# Patient Record
Sex: Female | Born: 1995 | Race: Black or African American | Hispanic: No | Marital: Married | Smoking: Current every day smoker
Health system: Southern US, Community
[De-identification: ages and names within clinical notes are randomized; demographics above are authoritative.]

## PROBLEM LIST (undated history)

## (undated) ENCOUNTER — Inpatient Hospital Stay (HOSPITAL_COMMUNITY): Payer: Self-pay

## (undated) DIAGNOSIS — J45909 Unspecified asthma, uncomplicated: Secondary | ICD-10-CM

## (undated) HISTORY — PX: CYST EXCISION: SHX5701

## (undated) HISTORY — PX: CYST REMOVAL HAND: SHX6279

---

## 2015-12-18 ENCOUNTER — Encounter (HOSPITAL_COMMUNITY): Payer: Self-pay | Admitting: *Deleted

## 2015-12-18 ENCOUNTER — Inpatient Hospital Stay (HOSPITAL_COMMUNITY)
Admission: AD | Admit: 2015-12-18 | Discharge: 2015-12-19 | Disposition: A | Discharge status: Home / Self Care | Payer: Medicaid Other | Source: Ambulatory Visit | Attending: Family Medicine | Admitting: Family Medicine

## 2015-12-18 DIAGNOSIS — Z3202 Encounter for pregnancy test, result negative: Secondary | ICD-10-CM | POA: Insufficient documentation

## 2015-12-18 DIAGNOSIS — R109 Unspecified abdominal pain: Secondary | ICD-10-CM

## 2015-12-18 DIAGNOSIS — J45909 Unspecified asthma, uncomplicated: Secondary | ICD-10-CM | POA: Insufficient documentation

## 2015-12-18 DIAGNOSIS — R102 Pelvic and perineal pain: Secondary | ICD-10-CM | POA: Insufficient documentation

## 2015-12-18 DIAGNOSIS — F17210 Nicotine dependence, cigarettes, uncomplicated: Secondary | ICD-10-CM | POA: Diagnosis not present

## 2015-12-18 DIAGNOSIS — O26899 Other specified pregnancy related conditions, unspecified trimester: Secondary | ICD-10-CM

## 2015-12-18 HISTORY — DX: Unspecified asthma, uncomplicated: J45.909

## 2015-12-18 LAB — URINALYSIS, ROUTINE W REFLEX MICROSCOPIC
BILIRUBIN URINE: NEGATIVE
Glucose, UA: NEGATIVE mg/dL
Hgb urine dipstick: NEGATIVE
KETONES UR: 15 mg/dL — AB
LEUKOCYTES UA: NEGATIVE
NITRITE: NEGATIVE
Protein, ur: 30 mg/dL — AB
pH: 6 (ref 5.0–8.0)

## 2015-12-18 LAB — CBC WITH DIFFERENTIAL/PLATELET
BASOS PCT: 1 %
Basophils Absolute: 0.1 10*3/uL (ref 0.0–0.1)
EOS ABS: 0 10*3/uL (ref 0.0–0.7)
Eosinophils Relative: 1 %
HEMATOCRIT: 37.3 % (ref 36.0–46.0)
HEMOGLOBIN: 13.6 g/dL (ref 12.0–15.0)
Lymphocytes Relative: 51 %
Lymphs Abs: 3.5 10*3/uL (ref 0.7–4.0)
MCH: 31.4 pg (ref 26.0–34.0)
MCHC: 36.5 g/dL — ABNORMAL HIGH (ref 30.0–36.0)
MCV: 86.1 fL (ref 78.0–100.0)
Monocytes Absolute: 0.4 10*3/uL (ref 0.1–1.0)
Monocytes Relative: 6 %
NEUTROS ABS: 2.7 10*3/uL (ref 1.7–7.7)
NEUTROS PCT: 41 %
Platelets: 209 10*3/uL (ref 150–400)
RBC: 4.33 MIL/uL (ref 3.87–5.11)
RDW: 11.9 % (ref 11.5–15.5)
WBC: 6.7 10*3/uL (ref 4.0–10.5)

## 2015-12-18 LAB — URINE MICROSCOPIC-ADD ON

## 2015-12-18 LAB — POCT PREGNANCY, URINE
PREG TEST UR: NEGATIVE
PREG TEST UR: NEGATIVE

## 2015-12-18 NOTE — MAU Note (Signed)
Pt states she has been having really bad cramps for the last 3 days, 3 positive home preg test at home. Denies bleeding.

## 2015-12-18 NOTE — MAU Provider Note (Signed)
History     CSN: [id number]  Arrival date and time: 12/18/15 2149   First Provider Initiated Contact with Patient 12/18/15 2231      Chief Complaint  Patient presents with  . Abdominal Pain  . Possible Pregnancy   HPI Ms. Beverly Santana is a 20 y.o.  who presents to MAU today with complaint of lower abdominal cramping and possible pregnancy. The patient states recent onset of moderate lower abdominal cramping. She states +HPT today. She denies vaginal bleeding or discharge. She states cramping is mild and intermittent. She has not taken anything for pain.   OB History    Gravida Para Term Preterm AB TAB SAB Ectopic Multiple Living   Past Medical History  Diagnosis Date  . Asthma     Past Surgical History  Procedure Laterality Date  . Cyst removal hand    . Cyst excision      History reviewed. No pertinent family history.  Social History  Substance Use Topics  . Smoking status: Current Every Day Smoker    Types: Cigarettes  . Smokeless tobacco: None  . Alcohol Use: No    Allergies:  Allergies  Allergen Reactions  . Latex Swelling  . Reglan [Metoclopramide]   . Sulfa Antibiotics     No prescriptions prior to admission    Review of Systems  Constitutional: Negative for fever and malaise/fatigue.  Gastrointestinal: Positive for abdominal pain. Negative for nausea, vomiting, diarrhea and constipation.  Genitourinary: Negative for dysuria, urgency and frequency.       Neg - vaginal bleeding, discharge   Physical Exam   Blood pressure 109/83, pulse 62, temperature 98.6 F (37 C), temperature source Oral, resp. rate 16, height 5' (1.524 m), weight 96 lb 4 oz (43.659 kg), last menstrual period 10/27/2015, SpO2 98 %.  Physical Exam  Nursing note and vitals reviewed. Constitutional: She is oriented to person, place, and time. She appears well-developed and well-nourished. No distress.  HENT:  Head: Normocephalic and atraumatic.   Cardiovascular: Normal rate.   Respiratory: Effort normal.  GI: Soft. She exhibits no distension.  Neurological: She is alert and oriented to person, place, and time.  Skin: Skin is warm and dry. No erythema.  Psychiatric: She has a normal mood and affect.    Results for orders placed or performed during the hospital encounter of 12/18/15 (from the past 24 hour(s))  Urinalysis, Routine w reflex microscopic (not at [facility])     Status: Abnormal   Collection Time: 12/18/15 10:06 PM  Result Value Ref Range   Color, Urine YELLOW YELLOW   APPearance CLEAR CLEAR   Specific Gravity, Urine >1.030 (H) 1.005 - 1.030   pH 6.0 5.0 - 8.0   Glucose, UA NEGATIVE NEGATIVE mg/dL   Hgb urine dipstick NEGATIVE NEGATIVE   Bilirubin Urine NEGATIVE NEGATIVE   Ketones, ur 15 (A) NEGATIVE mg/dL   Protein, ur 30 (A) NEGATIVE mg/dL   Nitrite NEGATIVE NEGATIVE   Leukocytes, UA NEGATIVE NEGATIVE  Urine microscopic-add on     Status: Abnormal   Collection Time: 12/18/15 10:06 PM  Result Value Ref Range   Squamous Epithelial / LPF 0-5 (A) NONE SEEN   WBC, UA 0-5 0 - 5 WBC/hpf   RBC / HPF 0-5 0 - 5 RBC/hpf   Bacteria, UA FEW (A) NONE SEEN   Urine-Other MUCOUS PRESENT   Pregnancy, urine POC     Status: None  Collection Time: 12/18/15 10:16 PM  Result Value Ref Range   Preg Test, Ur NEGATIVE NEGATIVE  Pregnancy, urine POC     Status: None   Collection Time: 12/18/15 10:17 PM  Result Value Ref Range   Preg Test, Ur NEGATIVE NEGATIVE  CBC with Differential/Platelet     Status: Abnormal   Collection Time: 12/18/15 11:32 PM  Result Value Ref Range   WBC 6.7 4.0 - 10.5 K/uL   RBC 4.33 3.87 - 5.11 MIL/uL   Hemoglobin 13.6 12.0 - 15.0 g/dL   HCT 16.1 09.6 - 04.5 %   MCV 86.1 78.0 - 100.0 fL   MCH 31.4 26.0 - 34.0 pg   MCHC 36.5 (H) 30.0 - 36.0 g/dL   RDW 40.9 81.1 - 91.4 %   Platelets 209 150 - 400 K/uL   Neutrophils Relative % 41 %   Neutro Abs 2.7 1.7 - 7.7 K/uL   Lymphocytes Relative 51 %   Lymphs  Abs 3.5 0.7 - 4.0 K/uL   Monocytes Relative 6 %   Monocytes Absolute 0.4 0.1 - 1.0 K/uL   Eosinophils Relative 1 %   Eosinophils Absolute 0.0 0.0 - 0.7 K/uL   Basophils Relative 1 %   Basophils Absolute 0.1 0.0 - 0.1 K/uL  ABO/Rh     Status: None   Collection Time: 12/18/15 11:32 PM  Result Value Ref Range   ABO/RH(D) B POS   hCG, quantitative, pregnancy     Status: None   Collection Time: 12/18/15 11:32 PM  Result Value Ref Range   hCG, Beta Chain, Quant, S <1 <5 mIU/mL    MAU Course  Procedures None  MDM UPT - negative Quant hCG ordered to confirm due to +HPT UA, CBC and ABO/Rh also ordered for evaluation of lower abdominal pain Patient declines pelvic exam.  Assessment and Plan  A: Pelvic pain in female  Negative pregnancy test  P: Discharge home Warning signs for worsening condition discussed Patient advised to follow-up with [facility] to discuss birth control options Patient may return to MAU as needed or if her condition were to change or worsen   Marny Lowenstein, PA-C  12/19/2015, 2:56 AM

## 2015-12-19 LAB — ABO/RH: ABO/RH(D): B POS

## 2015-12-19 LAB — HCG, QUANTITATIVE, PREGNANCY

## 2015-12-19 LAB — RPR: RPR Ser Ql: NONREACTIVE

## 2015-12-19 LAB — HIV ANTIBODY (ROUTINE TESTING W REFLEX): HIV SCREEN 4TH GENERATION: NONREACTIVE

## 2016-11-10 NOTE — L&D Delivery Note (Signed)
Patient is a 21 y.o. now [id number] s/p NSVD at 2066w1d, who was admitted for 250ml.  Delivery Note At 10:56 AM a viable female was delivered via  SVD (Presentation: direct OA).  APGAR: not recorded yet by NICU; weight not recorded yet .   Placenta status: abruption 3VC:  with the following complications: no complication.  Cord pH: pending  Anesthesia:  epidural Episiotomy:  N/A Lacerations:  N/A Suture Repair: N/A Est. Blood Loss (mL):    Mom to postpartum.  Baby to NICU.  Marthenia RollingScott Bland, DO 09/27/2017, 11:11 AM I was present for this delivery and agree with above assessment

## 2016-12-19 ENCOUNTER — Encounter (HOSPITAL_COMMUNITY): Payer: Self-pay | Admitting: Emergency Medicine

## 2016-12-19 ENCOUNTER — Emergency Department (HOSPITAL_COMMUNITY)
Admission: EM | Admit: 2016-12-19 | Discharge: 2016-12-19 | Disposition: A | Discharge status: Home / Self Care | Payer: Medicaid Other | Attending: Emergency Medicine | Admitting: Emergency Medicine

## 2016-12-19 DIAGNOSIS — F17210 Nicotine dependence, cigarettes, uncomplicated: Secondary | ICD-10-CM | POA: Insufficient documentation

## 2016-12-19 DIAGNOSIS — J45909 Unspecified asthma, uncomplicated: Secondary | ICD-10-CM | POA: Insufficient documentation

## 2016-12-19 DIAGNOSIS — R69 Illness, unspecified: Secondary | ICD-10-CM

## 2016-12-19 DIAGNOSIS — J111 Influenza due to unidentified influenza virus with other respiratory manifestations: Secondary | ICD-10-CM | POA: Insufficient documentation

## 2016-12-19 DIAGNOSIS — Z91040 Latex allergy status: Secondary | ICD-10-CM | POA: Insufficient documentation

## 2016-12-19 DIAGNOSIS — Z79899 Other long term (current) drug therapy: Secondary | ICD-10-CM | POA: Insufficient documentation

## 2016-12-19 MED ORDER — OSELTAMIVIR PHOSPHATE 75 MG PO CAPS: 75.0000 mg | ORAL_CAPSULE | Freq: Two times a day (BID) | ORAL | 0 refills | Status: DC

## 2016-12-19 MED ORDER — BENZONATATE 100 MG PO CAPS: 100.0000 mg | ORAL_CAPSULE | Freq: Three times a day (TID) | ORAL | 0 refills | Status: DC

## 2016-12-19 NOTE — ED Triage Notes (Signed)
Body aches  , feeling better and h/a and cough x 3 days

## 2016-12-19 NOTE — ED Provider Notes (Signed)
[facility]-EMERGENCY DEPT Provider Note   CSN: [id number] Arrival date & time: 12/19/16  1423  By signing my name below, I, Beverly Santana, attest that this documentation has been prepared under the direction and in the presence of Kerrie BuffaloHope Riyana Biel, NP. Electronically Signed: Alyssa GroveMartin Santana, ED Scribe. 12/19/16. 4:42 PM.   History   Chief Complaint Chief Complaint  Patient presents with  . Influenza   The history is provided by the patient. No language interpreter was used.   HPI Comments: Beverly Santana is a 21 y.o. female who presents to the Emergency Department complaining of gradual onset, constant, moderate generalized body aches 3 days ago. Pt reports associated dry cough, sore throat, headache, fever and diarrhea  (Tmax: 102.3 F). She has taken Tylenol and Ibuprofen with no significant relief to symptoms. Pt denies PMHx of HTN and DM. She denies chance of pregnancy and has not received a flu vaccination this year. Pt denies nausea, vomiting, ear pain, abdominal pain, back pain, leg swelling, chest pain, shortness of breath.  Past Medical History:  Diagnosis Date  . Asthma     There are no active problems to display for this patient.   Past Surgical History:  Procedure Laterality Date  . CYST EXCISION    . CYST REMOVAL HAND      OB History    Gravida Para Term Preterm AB Living   2 1 1   1 1    SAB TAB Ectopic Multiple Live Births   1               Home Medications    Prior to Admission medications   Medication Sig Start Date End Date Taking? Authorizing Provider  albuterol (ACCUNEB) 0.63 MG/3ML nebulizer solution Take 1 ampule by nebulization every 6 (six) hours as needed for wheezing.    Historical Provider, MD  benzonatate (TESSALON) 100 MG capsule Take 1 capsule (100 mg total) by mouth every 8 (eight) hours. 12/19/16   Kasarah Sitts Orlene OchM Jamarrion Budai, NP  oseltamivir (TAMIFLU) 75 MG capsule Take 1 capsule (75 mg total) by mouth every 12 (twelve) hours. 12/19/16   Kirin Pastorino Orlene OchM Bray Vickerman, NP     Family History No family history on file.  Social History Social History  Substance Use Topics  . Smoking status: Current Every Day Smoker    Types: Cigarettes  . Smokeless tobacco: Not on file  . Alcohol use No     Allergies   Latex; Reglan [metoclopramide]; and Sulfa antibiotics   Review of Systems Review of Systems  Constitutional: Positive for fever.  HENT: Positive for sore throat. Negative for ear pain.   Eyes: Negative for discharge and redness.  Respiratory: Negative for shortness of breath.   Cardiovascular: Negative for chest pain and leg swelling.  Gastrointestinal: Positive for diarrhea. Negative for abdominal pain, nausea and vomiting.  Musculoskeletal: Negative for neck stiffness.       +Generalized body aches  Skin: Negative for rash.  Neurological: Positive for headaches. Negative for syncope.  Psychiatric/Behavioral: Negative for confusion.   Physical Exam Updated Vital Signs BP 96/77 (BP Location: Right Arm)   Pulse 92   Temp 98.5 F (36.9 C) (Oral)   Resp 16   SpO2 100%   Physical Exam  Constitutional: She is oriented to person, place, and time. She appears well-developed and well-nourished. She is active. No distress.  HENT:  Head: Normocephalic and atraumatic.  Right Ear: Tympanic membrane normal.  Left Ear: Tympanic membrane normal.  uvula is midline, mild erythema, no  edema  Eyes: Conjunctivae and EOM are normal. Pupils are equal, round, and reactive to light.  Neck: Normal range of motion. Neck supple.  FROM of the neck No meningeal signs No cervical lymphadenopathy   Cardiovascular: Normal rate and regular rhythm.   Pulmonary/Chest: Effort normal. No respiratory distress. She has wheezes.  Occasional inspiratory wheeze  Abdominal: Soft. Bowel sounds are normal. There is no tenderness.  Musculoskeletal: Normal range of motion.  Neurological: She is alert and oriented to person, place, and time.  Skin: Skin is warm and dry.   Psychiatric: She has a normal mood and affect. Her behavior is normal.  Nursing note and vitals reviewed.    ED Treatments / Results  DIAGNOSTIC STUDIES: Oxygen Saturation is 100% on RA, normal by my interpretation.    COORDINATION OF CARE: 4:38 PM Discussed treatment plan with pt at bedside which includes prescription for Tamiflu and salt water gargle and pt agreed to plan.  Radiology No results found.  Procedures Procedures (including critical care time)  Medications Ordered in ED Medications - No data to display   Initial Impression / Assessment and Plan / ED Course  I have reviewed the triage vital signs and the nursing notes.  I personally performed the services described in this documentation, which was scribed in my presence. The recorded information has been reviewed and is accurate.   SUBJECTIVE:  Beverly Santana is a 21 y.o. female who present complaining of flu-like symptoms: fevers, chills, myalgias, congestion, sore throat and cough for 3 days. Denies dyspnea.  OBJECTIVE: Appears moderately ill but not toxic; temperature as noted in vitals. Ears normal. Throat and pharynx normal.  Neck supple. No adenopathy in the neck. Sinuses non tender. The chest is clear.  ASSESSMENT: Influenza  PLAN: Symptomatic therapy suggested: rest, increase fluids, gargle prn for sore throat, use mist of vaporizer. Return to the ED prn if these symptoms worsen or fail to improve as anticipated.  Final Clinical Impressions(s) / ED Diagnoses   Final diagnoses:  Influenza-like illness    New Prescriptions Discharge Medication List as of 12/19/2016  4:50 PM    START taking these medications   Details  benzonatate (TESSALON) 100 MG capsule Take 1 capsule (100 mg total) by mouth every 8 (eight) hours., Starting Fri 12/19/2016, Print    oseltamivir (TAMIFLU) 75 MG capsule Take 1 capsule (75 mg total) by mouth every 12 (twelve) hours., Starting Fri 12/19/2016, [address] [place], NP 12/20/16 1701    Maia Plan, MD 12/21/16 [phone]

## 2016-12-19 NOTE — ED Notes (Signed)
Pt states she understands instructions home stable with steady gait. 

## 2016-12-19 NOTE — ED Notes (Signed)
Pt c/o cough congestion and fever x 3 days. Diarrhea x 1 yesterday. No viomiting

## 2016-12-20 ENCOUNTER — Encounter (HOSPITAL_COMMUNITY): Payer: Self-pay

## 2016-12-20 ENCOUNTER — Emergency Department (HOSPITAL_COMMUNITY)
Admission: EM | Admit: 2016-12-20 | Discharge: 2016-12-20 | Disposition: A | Discharge status: Home / Self Care | Payer: Self-pay | Attending: Emergency Medicine | Admitting: Emergency Medicine

## 2016-12-20 DIAGNOSIS — R509 Fever, unspecified: Secondary | ICD-10-CM | POA: Insufficient documentation

## 2016-12-20 DIAGNOSIS — F17210 Nicotine dependence, cigarettes, uncomplicated: Secondary | ICD-10-CM | POA: Insufficient documentation

## 2016-12-20 DIAGNOSIS — Z91040 Latex allergy status: Secondary | ICD-10-CM | POA: Insufficient documentation

## 2016-12-20 DIAGNOSIS — R112 Nausea with vomiting, unspecified: Secondary | ICD-10-CM | POA: Insufficient documentation

## 2016-12-20 DIAGNOSIS — J45909 Unspecified asthma, uncomplicated: Secondary | ICD-10-CM | POA: Insufficient documentation

## 2016-12-20 DIAGNOSIS — R6889 Other general symptoms and signs: Secondary | ICD-10-CM

## 2016-12-20 MED ORDER — ALBUTEROL SULFATE HFA 108 (90 BASE) MCG/ACT IN AERS
2.0000 | INHALATION_SPRAY | RESPIRATORY_TRACT | Status: DC | PRN
  Administered 2016-12-20: 2 via RESPIRATORY_TRACT
  Filled 2016-12-20: qty 6.7

## 2016-12-20 NOTE — Discharge Instructions (Signed)
Use albuterol inhaler 2 puffs every 4 hours as needed for trouble breathing.  Take tamiflu as prescribed.  Rest, drink plenty of fluid and alternate between ibuprofen and tylenol for fever and body aches.

## 2016-12-20 NOTE — ED Notes (Signed)
Declined W/C at D/C and was escorted to lobby by RN. 

## 2016-12-20 NOTE — ED Notes (Signed)
Got patient discharge vital pt is ready for discharge

## 2016-12-20 NOTE — ED Provider Notes (Signed)
[facility] Provider Note   CSN: [id number] Arrival date & time: 12/20/16  1002  By signing my name below, I, Orpah CobbMaurice Copeland, attest that this documentation has been prepared under the direction and in the presence of Fayrene HelperBowie Srihaan Mastrangelo, PA-C. Electronically Signed: Orpah CobbMaurice Copeland , ED Scribe. 12/20/16. 11:36 AM.   History   Chief Complaint Chief Complaint  Patient presents with  . recheck flu    HPI  Beverly Heckndrea Collington is a 21 y.o. female who presents to the Emergency Department complaining of moderate flu symptoms with sudden onset x1 day. Pt was seen at the ED yesterday and diagnosed with flu. She was prescribed Tamiflu but states that in the past x24 hours she has been vomiting excessively. She reports fever (104.6), SOB, nausea, cough, post-tussive emesis, appetite change. She has taken Tylenol and Tamiflu with mild relief. Of note, pt is a smoker and states that when she breathes she feels as if she is not getting adequate oxygen in the R lung.   The history is provided by the patient. No language interpreter was used.    Past Medical History:  Diagnosis Date  . Asthma     There are no active problems to display for this patient.   Past Surgical History:  Procedure Laterality Date  . CYST EXCISION    . CYST REMOVAL HAND      OB History    Gravida Para Term Preterm AB Living   2 1 1   1 1    SAB TAB Ectopic Multiple Live Births   1               Home Medications    Prior to Admission medications   Medication Sig Start Date End Date Taking? Authorizing Provider  albuterol (ACCUNEB) 0.63 MG/3ML nebulizer solution Take 1 ampule by nebulization every 6 (six) hours as needed for wheezing.   Yes Historical Provider, MD  benzonatate (TESSALON) 100 MG capsule Take 1 capsule (100 mg total) by mouth every 8 (eight) hours. 12/19/16  Yes Hope Orlene OchM Neese, NP  oseltamivir (TAMIFLU) 75 MG capsule Take 1 capsule (75 mg total) by mouth every 12 (twelve) hours. 12/19/16  Yes Hope Orlene OchM  Neese, NP    Family History No family history on file.  Social History Social History  Substance Use Topics  . Smoking status: Current Every Day Smoker    Types: Cigarettes  . Smokeless tobacco: Not on file  . Alcohol use No     Allergies   Latex; Reglan [metoclopramide]; and Sulfa antibiotics   Review of Systems Review of Systems  Constitutional: Positive for appetite change, chills and fever.  HENT: Positive for congestion.   Respiratory: Positive for cough and shortness of breath.   Gastrointestinal: Positive for nausea and vomiting. Negative for diarrhea.     Physical Exam Updated Vital Signs BP 105/77   Pulse 105   Temp 98.6 F (37 C) (Oral)   Resp 18   SpO2 100%   Physical Exam  Constitutional: She appears well-developed and well-nourished. No distress.  HENT:  Head: Normocephalic and atraumatic.  Eyes: Conjunctivae are normal.  Neck: Neck supple.  Cardiovascular: Regular rhythm.  Tachycardia present.   No murmur heard. Pulmonary/Chest: Effort normal. No respiratory distress. She has wheezes.  Faint expiratory wheezes.  Abdominal: Soft. There is no tenderness.  Musculoskeletal: She exhibits no edema.  Neurological: She is alert.  Skin: Skin is warm and dry.  Psychiatric: She has a normal mood and affect.  Nursing note  and vitals reviewed.    ED Treatments / Results   DIAGNOSTIC STUDIES: Oxygen Saturation is 100% on RA, normal by my interpretation.   COORDINATION OF CARE: 11:36 AM-Discussed next steps with pt. Pt verbalized understanding and is agreeable with the plan.   Labs (all labs ordered are listed, but only abnormal results are displayed) Labs Reviewed - No data to display  EKG  EKG Interpretation None       Radiology No results found.  Procedures Procedures (including critical care time)  Medications Ordered in ED Medications - No data to display   Initial Impression / Assessment and Plan / ED Course  I have  reviewed the triage vital signs and the nursing notes.  Pertinent labs & imaging results that were available during my care of the patient were reviewed by me and considered in my medical decision making (see chart for details).     Patient with symptoms consistent with influenza.  Vitals are stable, low-grade fever.  No signs of dehydration, tolerating PO's.  Lungs are clear. Due to patient's presentation and physical exam a chest x-ray was not ordered bc likely diagnosis of flu.  Discussed the cost versus benefit of Tamiflu treatment with the patient.  The patient understands that symptoms are greater than the recommended 24-48 hour window of treatment.  Patient will be discharged with instructions to orally hydrate, rest, and use over-the-counter medications such as anti-inflammatories ibuprofen and Aleve for muscle aches and Tylenol for fever.  Patient will also be given a cough suppressant.    Final Clinical Impressions(s) / ED Diagnoses   Final diagnoses:  Flu-like symptoms    New Prescriptions New Prescriptions   No medications on file   I personally performed the services described in this documentation, which was scribed in my presence. The recorded information has been reviewed and is accurate.       Fayrene Helper, PA-C 12/20/16 1235    Mancel Bale, MD 12/20/16 1723

## 2016-12-20 NOTE — ED Triage Notes (Signed)
Patient here with recheck of flu that was diagnosed yesterday. States that she is having ongoing cough and doesn't think meds are working, NAD

## 2017-05-27 HISTORY — PX: CERVICAL CERCLAGE: SHX1329

## 2017-08-07 ENCOUNTER — Inpatient Hospital Stay (HOSPITAL_COMMUNITY)
Admission: AD | Admit: 2017-08-07 | Discharge: 2017-08-11 | DRG: 831 | Disposition: A | Discharge status: Home / Self Care | Payer: Medicaid Other | Source: Ambulatory Visit | Attending: Obstetrics & Gynecology | Admitting: Obstetrics & Gynecology

## 2017-08-07 ENCOUNTER — Encounter (HOSPITAL_COMMUNITY): Payer: Self-pay | Admitting: *Deleted

## 2017-08-07 ENCOUNTER — Inpatient Hospital Stay (HOSPITAL_COMMUNITY): Payer: Medicaid Other

## 2017-08-07 DIAGNOSIS — O4702 False labor before 37 completed weeks of gestation, second trimester: Secondary | ICD-10-CM | POA: Diagnosis not present

## 2017-08-07 DIAGNOSIS — Z87891 Personal history of nicotine dependence: Secondary | ICD-10-CM

## 2017-08-07 DIAGNOSIS — O3430 Maternal care for cervical incompetence, unspecified trimester: Secondary | ICD-10-CM | POA: Diagnosis present

## 2017-08-07 DIAGNOSIS — Z3A25 25 weeks gestation of pregnancy: Secondary | ICD-10-CM

## 2017-08-07 DIAGNOSIS — O0990 Supervision of high risk pregnancy, unspecified, unspecified trimester: Secondary | ICD-10-CM

## 2017-08-07 DIAGNOSIS — Z3A26 26 weeks gestation of pregnancy: Secondary | ICD-10-CM | POA: Diagnosis not present

## 2017-08-07 DIAGNOSIS — O26879 Cervical shortening, unspecified trimester: Secondary | ICD-10-CM | POA: Diagnosis present

## 2017-08-07 DIAGNOSIS — Z91040 Latex allergy status: Secondary | ICD-10-CM | POA: Diagnosis not present

## 2017-08-07 DIAGNOSIS — O3432 Maternal care for cervical incompetence, second trimester: Secondary | ICD-10-CM | POA: Diagnosis present

## 2017-08-07 DIAGNOSIS — R109 Unspecified abdominal pain: Secondary | ICD-10-CM | POA: Diagnosis not present

## 2017-08-07 DIAGNOSIS — O26872 Cervical shortening, second trimester: Secondary | ICD-10-CM

## 2017-08-07 LAB — URINALYSIS, ROUTINE W REFLEX MICROSCOPIC
Bilirubin Urine: NEGATIVE
GLUCOSE, UA: NEGATIVE mg/dL
Hgb urine dipstick: NEGATIVE
Ketones, ur: NEGATIVE mg/dL
Nitrite: NEGATIVE
PROTEIN: NEGATIVE mg/dL
Specific Gravity, Urine: 1.018 (ref 1.005–1.030)
pH: 6 (ref 5.0–8.0)

## 2017-08-07 LAB — WET PREP, GENITAL
Sperm: NONE SEEN
TRICH WET PREP: NONE SEEN
YEAST WET PREP: NONE SEEN

## 2017-08-07 LAB — RAPID URINE DRUG SCREEN, HOSP PERFORMED
Amphetamines: NOT DETECTED
BARBITURATES: NOT DETECTED
BENZODIAZEPINES: NOT DETECTED
COCAINE: NOT DETECTED
OPIATES: NOT DETECTED
Tetrahydrocannabinol: NOT DETECTED

## 2017-08-07 LAB — CBC
HEMATOCRIT: 32.1 % — AB (ref 36.0–46.0)
HEMOGLOBIN: 11.5 g/dL — AB (ref 12.0–15.0)
MCH: 32.2 pg (ref 26.0–34.0)
MCHC: 35.8 g/dL (ref 30.0–36.0)
MCV: 89.9 fL (ref 78.0–100.0)
Platelets: 158 10*3/uL (ref 150–400)
RBC: 3.57 MIL/uL — AB (ref 3.87–5.11)
RDW: 12.6 % (ref 11.5–15.5)
WBC: 7.8 10*3/uL (ref 4.0–10.5)

## 2017-08-07 MED ORDER — LACTATED RINGERS IV BOLUS (SEPSIS)
1000.0000 mL | Freq: Once | INTRAVENOUS | Status: AC
  Administered 2017-08-07: 1000 mL via INTRAVENOUS

## 2017-08-07 MED ORDER — NIFEDIPINE 10 MG PO CAPS
10.0000 mg | ORAL_CAPSULE | Freq: Once | ORAL | Status: AC
  Administered 2017-08-07: 10 mg via ORAL
  Filled 2017-08-07: qty 1

## 2017-08-07 MED ORDER — NALBUPHINE HCL 10 MG/ML IJ SOLN
10.0000 mg | Freq: Once | INTRAMUSCULAR | Status: AC
Start: 2017-08-07 — End: 2017-08-07
  Administered 2017-08-07: 10 mg via INTRAVENOUS
  Filled 2017-08-07: qty 1

## 2017-08-07 MED ORDER — BETAMETHASONE SOD PHOS & ACET 6 (3-3) MG/ML IJ SUSP
12.0000 mg | Freq: Once | INTRAMUSCULAR | Status: AC
  Administered 2017-08-07: 12 mg via INTRAMUSCULAR
  Filled 2017-08-07: qty 2

## 2017-08-07 MED ORDER — INDOMETHACIN 50 MG PO CAPS
50.0000 mg | ORAL_CAPSULE | Freq: Once | ORAL | Status: AC
  Administered 2017-08-07: 50 mg via ORAL
  Filled 2017-08-07: qty 1

## 2017-08-07 MED ORDER — INDOMETHACIN 25 MG PO CAPS
25.0000 mg | ORAL_CAPSULE | Freq: Four times a day (QID) | ORAL | Status: DC
  Administered 2017-08-08 – 2017-08-10 (×9): 25 mg via ORAL
  Filled 2017-08-07 (×10): qty 1
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
## 2017-08-08 DIAGNOSIS — O4702 False labor before 37 completed weeks of gestation, second trimester: Secondary | ICD-10-CM

## 2017-08-08 LAB — TYPE AND SCREEN
ABO/RH(D): B POS
ANTIBODY SCREEN: NEGATIVE

## 2017-08-08 LAB — HIV ANTIBODY (ROUTINE TESTING W REFLEX): HIV Screen 4th Generation wRfx: NONREACTIVE

## 2017-08-08 MED ORDER — TERBUTALINE SULFATE 1 MG/ML IJ SOLN
INTRAMUSCULAR | Status: AC
  Filled 2017-08-08: qty 1

## 2017-08-08 MED ORDER — IPRATROPIUM-ALBUTEROL 0.5-2.5 (3) MG/3ML IN SOLN
3.0000 mL | Freq: Four times a day (QID) | RESPIRATORY_TRACT | Status: DC
  Administered 2017-08-08 – 2017-08-09 (×2): 3 mL via RESPIRATORY_TRACT
  Filled 2017-08-08 (×4): qty 3

## 2017-08-08 MED ORDER — LACTATED RINGERS IV BOLUS (SEPSIS)
500.0000 mL | Freq: Once | INTRAVENOUS | Status: AC
  Administered 2017-08-08: 500 mL via INTRAVENOUS

## 2017-08-08 MED ORDER — PRENATAL MULTIVITAMIN CH
1.0000 | ORAL_TABLET | Freq: Every day | ORAL | Status: DC
  Administered 2017-08-08 – 2017-08-10 (×3): 1 via ORAL
  Filled 2017-08-08 (×3): qty 1

## 2017-08-08 MED ORDER — LACTATED RINGERS IV SOLN
INTRAVENOUS | Status: DC
  Administered 2017-08-08 – 2017-08-09 (×3): via INTRAVENOUS

## 2017-08-08 MED ORDER — CALCIUM CARBONATE ANTACID 500 MG PO CHEW: 2.0000 | CHEWABLE_TABLET | ORAL | Status: DC | PRN

## 2017-08-08 MED ORDER — MAGNESIUM SULFATE BOLUS VIA INFUSION
4.0000 g | Freq: Once | INTRAVENOUS | Status: AC
  Administered 2017-08-08: 4 g via INTRAVENOUS
  Filled 2017-08-08: qty 500

## 2017-08-08 MED ORDER — ZOLPIDEM TARTRATE 5 MG PO TABS
5.0000 mg | ORAL_TABLET | Freq: Every evening | ORAL | Status: DC | PRN
  Administered 2017-08-08 (×2): 5 mg via ORAL
  Filled 2017-08-08 (×2): qty 1

## 2017-08-08 MED ORDER — DOCUSATE SODIUM 100 MG PO CAPS
100.0000 mg | ORAL_CAPSULE | Freq: Every day | ORAL | Status: DC
  Administered 2017-08-08 – 2017-08-11 (×4): 100 mg via ORAL
  Filled 2017-08-08 (×4): qty 1

## 2017-08-08 MED ORDER — TERBUTALINE SULFATE 1 MG/ML IJ SOLN
0.2500 mg | Freq: Once | INTRAMUSCULAR | Status: AC
  Administered 2017-08-08: 0.25 mg via SUBCUTANEOUS

## 2017-08-08 MED ORDER — ACETAMINOPHEN 325 MG PO TABS
650.0000 mg | ORAL_TABLET | ORAL | Status: DC | PRN
  Administered 2017-08-09 – 2017-08-11 (×3): 650 mg via ORAL
  Filled 2017-08-08 (×3): qty 2

## 2017-08-08 MED ORDER — BETAMETHASONE SOD PHOS & ACET 6 (3-3) MG/ML IJ SUSP
12.0000 mg | INTRAMUSCULAR | Status: DC
  Administered 2017-08-08: 12 mg via INTRAMUSCULAR
  Filled 2017-08-08 (×2): qty 2

## 2017-08-08 MED ORDER — MAGNESIUM SULFATE 40 G IN LACTATED RINGERS - SIMPLE
2.0000 g/h | INTRAVENOUS | Status: AC
  Administered 2017-08-08 – 2017-08-09 (×2): 2 g/h via INTRAVENOUS
  Filled 2017-08-08: qty 40
  Filled 2017-08-08: qty 500

## 2017-08-08 NOTE — Progress Notes (Signed)
RN assistance at bedside.  MD called again & message given to RN that MD must come evaluate pt stat secondary to pt breathing through UCs

## 2017-08-08 NOTE — Progress Notes (Signed)
Pt. Reported a small amount of egg white discharge, at admission Pad placed to monitor. No change in amount or type of output

## 2017-08-08 NOTE — Progress Notes (Signed)
   08/08/17 1735  Oxygen Therapy  SpO2 99 %  Hourly Rounding  Assessment Patient resting;Pain improved (see assessment)  Intervention Call light w/in reach;Environment secured;Pain assessed  Pain Assessment  Pain Assessment No/denies pain  Pain Score 0  Magnesium Sulfate  R Breath Sounds Inspiratory wheezes  L Breath Sounds Inspiratory wheezes  Report given to Dr Alysia Penna re: above assessment. No new orders rec'd.

## 2017-08-08 NOTE — Progress Notes (Signed)
Report given to Dr Alysia Penna re: bilat wheezing noted.  Orders rec'd that pt may use own inhaler 2 puffs one time & call if additional orders needed.

## 2017-08-08 NOTE — Progress Notes (Signed)
Patient ID: Beverly Santana, female   DOB: [birth year], 21 y.o.   MRN: [id number] FACULTY PRACTICE ANTEPARTUM(COMPREHENSIVE) NOTE  Beverly Santana is a 21 y.o. [id number] at [redacted]w[redacted]d by early ultrasound who is admitted for Preterm labor, incompetent cervix with cerclage.   Fetal presentation is cephalic. Length of Stay:  1  Days  Subjective: Sleeping now  Vitals:  Blood pressure 105/71, pulse 73, temperature 98.3 F (36.8 C), temperature source Oral, resp. rate 18, height 5' (1.524 m), weight 47.1 kg (103 lb 12 oz). Physical Examination:  General appearance - resting comfortably Heart - normal rate and regular rhythm Fundal Height:  size equals dates Cervical Exam: Not evaluated. . Extremities: extremities normal, atraumatic, no cyanosis or edema and Homans sign is negative, no sign of DVT with DTRs  Membranes:intact  Fetal Monitoring:     Fetal Heart Rate A  Mode External filed at 08/08/2017 0600  Baseline Rate (A) 130 bpm filed at 08/08/2017 0600  Variability 6-25 BPM filed at 08/08/2017 0600  Accelerations 15 x 15 filed at 08/08/2017 0600  Decelerations None filed at 08/08/2017 0600     Labs:  Results for orders placed or performed during the hospital encounter of 08/07/17 (from the past 24 hour(s))  Urinalysis, Routine w reflex microscopic   Collection Time: 08/07/17  8:53 PM  Result Value Ref Range   Color, Urine YELLOW YELLOW   APPearance CLEAR CLEAR   Specific Gravity, Urine 1.018 1.005 - 1.030   pH 6.0 5.0 - 8.0   Glucose, UA NEGATIVE NEGATIVE mg/dL   Hgb urine dipstick NEGATIVE NEGATIVE   Bilirubin Urine NEGATIVE NEGATIVE   Ketones, ur NEGATIVE NEGATIVE mg/dL   Protein, ur NEGATIVE NEGATIVE mg/dL   Nitrite NEGATIVE NEGATIVE   Leukocytes, UA SMALL (A) NEGATIVE   RBC / HPF 0-5 0 - 5 RBC/hpf   WBC, UA 0-5 0 - 5 WBC/hpf   Bacteria, UA RARE (A) NONE SEEN   Squamous Epithelial / LPF 0-5 (A) NONE SEEN   Mucus PRESENT   Rapid urine drug screen (hospital  performed)   Collection Time: 08/07/17  8:53 PM  Result Value Ref Range   Opiates NONE DETECTED NONE DETECTED   Cocaine NONE DETECTED NONE DETECTED   Benzodiazepines NONE DETECTED NONE DETECTED   Amphetamines NONE DETECTED NONE DETECTED   Tetrahydrocannabinol NONE DETECTED NONE DETECTED   Barbiturates NONE DETECTED NONE DETECTED  Wet prep, genital   Collection Time: 08/07/17  9:25 PM  Result Value Ref Range   Yeast Wet Prep HPF POC NONE SEEN NONE SEEN   Trich, Wet Prep NONE SEEN NONE SEEN   Clue Cells Wet Prep HPF POC PRESENT (A) NONE SEEN   WBC, Wet Prep HPF POC FEW (A) NONE SEEN   Sperm NONE SEEN   CBC   Collection Time: 08/07/17 10:49 PM  Result Value Ref Range   WBC 7.8 4.0 - 10.5 K/uL   RBC 3.57 (L) 3.87 - 5.11 MIL/uL   Hemoglobin 11.5 (L) 12.0 - 15.0 g/dL   HCT 24.4 (L) 01.0 - 27.2 %   MCV 89.9 78.0 - 100.0 fL   MCH 32.2 26.0 - 34.0 pg   MCHC 35.8 30.0 - 36.0 g/dL   RDW 53.6 64.4 - 03.4 %   Platelets 158 150 - 400 K/uL  Type and screen   Collection Time: 08/07/17 10:49 PM  Result Value Ref Range   ABO/RH(D) B POS    Antibody Screen NEG    Sample Expiration 08/10/2017  Medications:  Scheduled . betamethasone acetate-betamethasone sodium phosphate  12 mg Intramuscular Q24H  . docusate sodium  100 mg Oral Daily  . indomethacin  25 mg Oral Q6H  . prenatal multivitamin  1 tablet Oral Q1200   I have reviewed the patient's current medications.  ASSESSMENT: Patient Active Problem List   Diagnosis Date Noted  . Preterm uterine contractions in second trimester, antepartum 08/08/2017  incompetent cervix, cerclage since 14 weeks  PLAN: Betamethasone X2, continue 72 hr indocin   Scheryl Darter 08/08/2017,7:01 AM

## 2017-08-08 NOTE — Progress Notes (Signed)
Patient ID: Beverly Santana, female   DOB: 11-20-1995, 21 y.o.   MRN: [id number] CTSP d/t c/o ut ctx q 3-5 mins starting around 12:30. No vaginal bleeding or LOF.  Pt givem SQ terb x 1 and ut ctx resolved. Pt was also due her Indocin. SVE deferred  A/P IUP 26 weeks        Incompetent cervix, cerclage in place        PTL  Stable presently. Will continue with Indocin. Discussed with pt.
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
## 2017-08-08 NOTE — Progress Notes (Signed)
Patient ID: Beverly Santana, female   DOB: [birth year], 21 y.o.   MRN: [id number] [facility] Attending  Pt had some more ut ctx Started on Magnesium. With resolution until a while ago and had a run of ut ctx. This now have resolved as well. She also has some wheezing. She denies any H/O asthma but uses a MDI PRN Difficult to trace infant as well. U/S used to locate FHT's   PE AF VSS O2 sat 96 % FHT's 140's  Luns faint inx and exp wheezes Heart RRR Abd soft gravid non tender  A/P Stable. Will start Duonebs for wheezing. Continue with current management.

## 2017-08-08 NOTE — Progress Notes (Signed)
Dr Doroteo Glassman called & report given re: pt c/o UCs as 8 on 0-10 pain scale.  UCs palpated by RN q2-3 mins as mild-mod, pt tenses with UCs.  Toco readjusted.  Dr Doroteo Glassman states she will notify Dr Rande Lawman & return phone call.

## 2017-08-08 NOTE — Progress Notes (Deleted)
Dr Juliene Pina in dept.  Report given re: pt status, orders clarified. See orders.
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
## 2017-08-08 NOTE — Progress Notes (Addendum)
RN called into room. Pt sitting on side of bed breathing through UCs & states "this is too much".  Mod UC palpated.  MD called & message left with RN for MD to come evaluate pt.

## 2017-08-09 ENCOUNTER — Encounter (HOSPITAL_COMMUNITY): Payer: Self-pay

## 2017-08-09 LAB — MAGNESIUM: MAGNESIUM: 5.8 mg/dL — AB (ref 1.7–2.4)

## 2017-08-09 MED ORDER — PROGESTERONE MICRONIZED 200 MG PO CAPS
200.0000 mg | ORAL_CAPSULE | Freq: Every day | ORAL | Status: DC
  Administered 2017-08-09 – 2017-08-10 (×2): 200 mg via VAGINAL
  Filled 2017-08-09 (×2): qty 1

## 2017-08-09 NOTE — Progress Notes (Signed)
   08/09/17 1345  Oxygen Therapy  SpO2 99 %  Magnesium Sulfate  Patient on Magnesium Sulfate? Yes  Level of Consciousness Responds to Voice  Orientation Level Oriented X4  Reflexes Plus 2  Clonus Absent  R Breath Sounds Clear  L Breath Sounds Clear  Provider Notification  Provider Name/Title Rayburn Ma, MD  Method of Notification Phone  Notification Reason Order request   Pt having difficulty sitting up straight and standing d/t drowsiness.  Above assessment given to MD.  Orders rec'd.  Pt instructed to call for help for toileting assistance with bedpan.  Pt verb understanding.

## 2017-08-09 NOTE — Progress Notes (Signed)
Patient ID: Beverly Santana, female   DOB: 1996/05/11, 21 y.o.   MRN: [id number] ACULTY PRACTICE ANTEPARTUM COMPREHENSIVE PROGRESS NOTE  Beverly Santana is a 21 y.o. [id number] at [redacted]w[redacted]d  who is admitted for PTL.   Fetal presentation is cephalic. Length of Stay:  2  Days  Subjective: Pt without complaints this morning. Was able to rest last night. No more cramps, pressure or ut ctx. + FM. Breathing much better as well.   Vitals:  Blood pressure (!) 92/49, pulse 96, temperature 98.5 F (36.9 C), temperature source Oral, resp. rate 16, height 5' (1.524 m), weight 47.1 kg (103 lb 12 oz), SpO2 100 %.   Physical Examination: Lungs clear Heart RRR Abd soft + BS gravid non tender Ext non tender  Fetal Monitoring:  140-150's, good LTV  Labs:  No results found for this or any previous visit (from the past 24 hour(s)).  Imaging Studies:    none   Medications:  Scheduled . betamethasone acetate-betamethasone sodium phosphate  12 mg Intramuscular Q24H  . docusate sodium  100 mg Oral Daily  . indomethacin  25 mg Oral Q6H  . ipratropium-albuterol  3 mL Nebulization Q6H  . prenatal multivitamin  1 tablet Oral Q1200   I have reviewed the patient's current medications.  ASSESSMENT: IUP 26 1/7 weeks PTL Incompetent cervix with cerclage in place  PLAN: Stable. Will continue with magnesium for 24 hours and then d/c. Continue with Indocin for 72 hrs  D/C Duoneb treatments as well. S/P BMZ. Appreciate SW consult.  Continue routine antenatal care.   Hermina Staggers 08/09/2017,6:27 AM

## 2017-08-09 NOTE — Plan of Care (Signed)
Problem: Education: Goal: Knowledge of disease or condition will improve Outcome: Progressing Pt continues to ask about going home & has been educated that abdominal pain would need to subside before d/c.  Pt is aware of POC for PTL  Problem: Coping: Goal: Ability to identify and develop effective coping behavior will improve Outcome: Progressing Pt noted to be arguing on the phone with FOB.  Pt instructed to try to keep stress at a minimum.  Pt has been alone in room today.  Problem: Pain Management: Goal: Relief or control of pain will improve Outcome: Progressing Pt c/o constant back pain.  Will give heat pad & evaluate relief.

## 2017-08-10 ENCOUNTER — Inpatient Hospital Stay (HOSPITAL_COMMUNITY): Payer: Medicaid Other

## 2017-08-10 ENCOUNTER — Encounter (HOSPITAL_COMMUNITY): Payer: Self-pay | Admitting: Obstetrics & Gynecology

## 2017-08-10 DIAGNOSIS — Z3A26 26 weeks gestation of pregnancy: Secondary | ICD-10-CM

## 2017-08-10 DIAGNOSIS — O0990 Supervision of high risk pregnancy, unspecified, unspecified trimester: Secondary | ICD-10-CM

## 2017-08-10 DIAGNOSIS — O26879 Cervical shortening, unspecified trimester: Secondary | ICD-10-CM | POA: Diagnosis present

## 2017-08-10 DIAGNOSIS — O3430 Maternal care for cervical incompetence, unspecified trimester: Secondary | ICD-10-CM | POA: Diagnosis present

## 2017-08-10 LAB — GC/CHLAMYDIA PROBE AMP (~~LOC~~) NOT AT ARMC
Chlamydia: NEGATIVE
NEISSERIA GONORRHEA: NEGATIVE

## 2017-08-10 MED ORDER — NIFEDIPINE ER OSMOTIC RELEASE 30 MG PO TB24
30.0000 mg | ORAL_TABLET | Freq: Two times a day (BID) | ORAL | Status: DC
  Administered 2017-08-10 – 2017-08-11 (×3): 30 mg via ORAL
  Filled 2017-08-10 (×3): qty 1

## 2017-08-10 MED ORDER — NIFEDIPINE 10 MG PO CAPS: 10.0000 mg | ORAL_CAPSULE | ORAL | Status: DC | PRN

## 2017-08-10 MED ORDER — METRONIDAZOLE 500 MG PO TABS
500.0000 mg | ORAL_TABLET | Freq: Two times a day (BID) | ORAL | Status: DC
  Administered 2017-08-10 – 2017-08-11 (×3): 500 mg via ORAL
  Filled 2017-08-10 (×3): qty 1

## 2017-08-10 MED ORDER — LACTATED RINGERS IV BOLUS (SEPSIS)
1000.0000 mL | Freq: Once | INTRAVENOUS | Status: AC
  Administered 2017-08-10: 1000 mL via INTRAVENOUS

## 2017-08-10 NOTE — Progress Notes (Signed)
[facility]ulty [facility] OB/[facility]N Attending Note   Subjective:  Called to evaluate patient with frequent painful contractions.  Just returned from MFM ultrasound.  FHR reassuring, no LOF or vaginal bleeding. Good FM.   Admitted on 08/07/2017 for Preterm uterine contractions in second trimester, antepartum.    Objective:  Blood pressure 101/61, pulse 93, temperature 99 F (37.2 C), temperature source Oral, resp. rate 16, height 5' (1.524 m), weight 103 lb 12 oz (47.1 kg), SpO2 99 %. FHT  Baseline 140bpm, moderate variability, +accelerations, no decelerations Toco: q1-2 minutes Gen: NAD HENT: Normocephalic, atraumatic Lungs: Normal respiratory effort Heart: Regular rate noted Abdomen: NT gravid fundus, soft Cervix: closed/50/-1 Ext: 2+ DTRs, no edema, no cyanosis, negative Homan's sign  [place] Mfm Ob Transvaginal  Result Date: 08/10/2017 ----------------------------------------------------------------------  OBSTETRICS REPORT                      (Signed Final 08/10/2017 11:13 am) ---------------------------------------------------------------------- Patient Info  ID #:       [id number]                         D.O.B.:   10-16-1996 (20 yrs)  Name:       Beverly Santana              Visit Date:  08/10/2017 10:28 am              FLEMING ---------------------------------------------------------------------- Performed By  Performed By:     Marcellina Millin          Ref. Address:     [address]                                                             [place], [place]                                                             [address]  Attending:        Particia Nearing MD       Secondary Phy.:   3rd Nursing- HR                                                             OB  3rd Floor  Referred By:      Jethro Bastos               Location:         [facility] MD ---------------------------------------------------------------------- Orders   #  Description                                 Code   1  [place] MFM OB TRANSVAGINAL                      [phone]  ----------------------------------------------------------------------   #  Ordered By               Order #        Accession #    Episode #   1  Jaynie Collins           [id number]      [id number]     [id number]  ---------------------------------------------------------------------- Indications   [redacted] weeks gestation of pregnancy                Z3A.26   Preterm labor without delivery, second         O60.02   trimester   Poor obstetric history: Previous preterm       O09.219   delivery, antepartum 28 weeks   Cervical cerclage suture present, second       O34.32   trimester   Cervical insufficiency, 2nd                    O34.32   Encounter for cervical length                  Z36.86  ---------------------------------------------------------------------- OB History  Gravidity:    3         Prem:   1         SAB:   1  Living:       1 ---------------------------------------------------------------------- Fetal Evaluation  Num Of Fetuses:     1  Fetal Heart         159  Rate(bpm):  Cardiac Activity:   Observed  Presentation:       Cephalic  Amniotic Fluid  AFI FV:      Subjectively within normal limits ---------------------------------------------------------------------- Gestational Age  Clinical EDD:  26w 2d                                        EDD:   11/14/17  Best:          26w 2d    Det. By:   Clinical EDD             EDD:   11/14/17 ---------------------------------------------------------------------- Cervix Uterus Adnexa  Cervix  Length:            2.5  cm.  Measured transvaginally. Cerclage visualized. ---------------------------------------------------------------------- Impression  SIUP at 26+2 weeks  Cephalic presentation  Normal amniotic fluid volume  EV views of cervix: CL = 2.5 cms without funneling;  suture  visualized and appeared to be intact ---------------------------------------------------------------------- Recommendations  Follow-up as clinically indicated ----------------------------------------------------------------------  Particia Nearing, MD Electronically Signed Final Report   08/10/2017 11:13 am ----------------------------------------------------------------------  [place] Mfm Ob Transvaginal  Result Date: 08/08/2017 ----------------------------------------------------------------------  OBSTETRICS REPORT                      (Signed Final 08/08/2017 01:10 pm) ---------------------------------------------------------------------- Patient Info  ID #:       [id number]                          D.O.B.:  May 10, 1996 (20 yrs)  Name:       Beverly Santana               Visit Date: 08/07/2017 10:01 pm              FLEMING ---------------------------------------------------------------------- Performed By  Performed By:     Ellin Saba        Referred By:       MAU Nursing-                    RDMS                                      MAU/Triage  Attending:        Durwin Nora       Location:          [facility]                    MD ---------------------------------------------------------------------- Orders   #  Description                                 Code   1  [place] MFM OB TRANSVAGINAL                      [phone]  ----------------------------------------------------------------------   #  Ordered By               Order #        Accession #    Episode #   1  Judeth Horn            [id number]      [id number]     [id number]  ---------------------------------------------------------------------- Indications   [redacted] weeks gestation of pregnancy                Z3A.25   Preterm labor without delivery, second         O60.02   trimester   Poor obstetric history: Previous preterm       O09.219   delivery, antepartum 28 weeks   Cervical cerclage suture present, second       O34.32    trimester   Cervical insufficiency, 2nd                    O34.32   Encounter for cervical length                  Z36.86  ---------------------------------------------------------------------- OB History  Gravidity:    3         Prem:   1         SAB:   1  Living:       1 ---------------------------------------------------------------------- Fetal Evaluation  Num Of Fetuses:     1  Fetal Heart         144  Rate(bpm):  Cardiac Activity:   Observed  Presentation:       Cephalic  Amniotic Fluid  AFI FV:      Subjectively within normal limits ---------------------------------------------------------------------- Gestational Age  Clinical EDD:  25w 6d                                        EDD:   11/14/17  Best:          25w 6d     Det. By:  Clinical EDD             EDD:   11/14/17 ---------------------------------------------------------------------- Cervix Uterus Adnexa  Cervix  Length:            2.1  cm.  Cerclage visualized. ---------------------------------------------------------------------- Impression  SIUP at [redacted]w[redacted]d  active fetus  cephalic presentation  amniotic fluid volume is gestational age appropriate  cervix is closed with a length of 2.1cm  cerclage is visualized and is intact ---------------------------------------------------------------------- Recommendations  Recommend clinical correlation with follow up/management  as clinically indicated. ----------------------------------------------------------------------               Durwin Nora, MD Electronically Signed Final Report   08/08/2017 01:10 pm ----------------------------------------------------------------------   Assessment & Plan:  21 y.o. [id number] at [redacted]w[redacted]d admitted for preterm contractions in the setting of history of cervical incompetence with cerclage in place. Even though ultrasound showed improved cervical length, she is having contractions that are painful. On Nifedipine XR 30 bid, will order LR 1 L bolus now.  Reassuring FHT.   Continue close observation.  Jaynie Collins, MD, FACOG Attending Obstetrician & Gynecologist Faculty Practice, [facility]

## 2017-08-10 NOTE — Progress Notes (Signed)
Pt. Requested RN to the bedside ~ 0115 AM. Pt. Noted to be having strong and painful contractions. Page to Dr. Shawnie Pons and PRN order for procardia given. Pt. With spontaneous end to UCs and procardia not given to pt.  Will continue to monitor.

## 2017-08-10 NOTE — Progress Notes (Addendum)
Patient ID: Beverly Santana, female   DOB: April 28, 1996, 21 y.o.   MRN: [id number] ACULTY PRACTICE ANTEPARTUM COMPREHENSIVE PROGRESS NOTE  Beverly Santana is a 21 y.o. [id number] at [redacted]w[redacted]d  whowas  admitted for PTL.   Fetal presentation is cephalic. Length of Stay:  3  Days  Subjective: Patient complains of more painful contractions this morning, has not taken any Procardia yet this morning. No bleeding, no LOF. Good FM  Vitals:  Blood pressure 104/66, pulse 87, temperature 98.9 F (37.2 C), temperature source Oral, resp. rate 16, height 5' (1.524 m), weight 103 lb 12 oz (47.1 kg), SpO2 100 %.   Physical Examination: Gen NAD Lungs clear  Heart RRR Abd soft + BS gravid non tender Ext non tender  Fetal Monitoring:  140-150's, good LTV  Labs:  Results for orders placed or performed during the hospital encounter of 08/07/17 (from the past 24 hour(s))  Magnesium   Collection Time: 08/09/17  2:19 PM  Result Value Ref Range   Magnesium 5.8 (H) 1.7 - 2.4 mg/dL   Results for orders placed or performed during the hospital encounter of 08/07/17 (from the past 72 hour(s))  Urinalysis, Routine w reflex microscopic     Status: Abnormal   Collection Time: 08/07/17  8:53 PM  Result Value Ref Range   Color, Urine YELLOW YELLOW   APPearance CLEAR CLEAR   Specific Gravity, Urine 1.018 1.005 - 1.030   pH 6.0 5.0 - 8.0   Glucose, UA NEGATIVE NEGATIVE mg/dL   Hgb urine dipstick NEGATIVE NEGATIVE   Bilirubin Urine NEGATIVE NEGATIVE   Ketones, ur NEGATIVE NEGATIVE mg/dL   Protein, ur NEGATIVE NEGATIVE mg/dL   Nitrite NEGATIVE NEGATIVE   Leukocytes, UA SMALL (A) NEGATIVE   RBC / HPF 0-5 0 - 5 RBC/hpf   WBC, UA 0-5 0 - 5 WBC/hpf   Bacteria, UA RARE (A) NONE SEEN   Squamous Epithelial / LPF 0-5 (A) NONE SEEN   Mucus PRESENT   Rapid urine drug screen (hospital performed)     Status: None   Collection Time: 08/07/17  8:53 PM  Result Value Ref Range   Opiates NONE DETECTED NONE  DETECTED   Cocaine NONE DETECTED NONE DETECTED   Benzodiazepines NONE DETECTED NONE DETECTED   Amphetamines NONE DETECTED NONE DETECTED   Tetrahydrocannabinol NONE DETECTED NONE DETECTED   Barbiturates NONE DETECTED NONE DETECTED    Comment:        DRUG SCREEN FOR MEDICAL PURPOSES ONLY.  IF CONFIRMATION IS NEEDED FOR ANY PURPOSE, NOTIFY LAB WITHIN 5 DAYS.        LOWEST DETECTABLE LIMITS FOR URINE DRUG SCREEN Drug Class       Cutoff (ng/mL) Amphetamine      1000 Barbiturate      200 Benzodiazepine   200 Tricyclics       300 Opiates          300 Cocaine          300 THC              50   Wet prep, genital     Status: Abnormal   Collection Time: 08/07/17  9:25 PM  Result Value Ref Range   Yeast Wet Prep HPF POC NONE SEEN NONE SEEN   Trich, Wet Prep NONE SEEN NONE SEEN   Clue Cells Wet Prep HPF POC PRESENT (A) NONE SEEN   WBC, Wet Prep HPF POC FEW (A) NONE SEEN    Comment: MODERATE BACTERIA SEEN  Sperm NONE SEEN   CBC     Status: Abnormal   Collection Time: 08/07/17 10:49 PM  Result Value Ref Range   WBC 7.8 4.0 - 10.5 K/uL   RBC 3.57 (L) 3.87 - 5.11 MIL/uL   Hemoglobin 11.5 (L) 12.0 - 15.0 g/dL   HCT 16.1 (L) 09.6 - 04.5 %   MCV 89.9 78.0 - 100.0 fL   MCH 32.2 26.0 - 34.0 pg   MCHC 35.8 30.0 - 36.0 g/dL   RDW 40.9 81.1 - 91.4 %   Platelets 158 150 - 400 K/uL  Type and screen     Status: None   Collection Time: 08/07/17 10:49 PM  Result Value Ref Range   ABO/RH(D) B POS    Antibody Screen NEG    Sample Expiration 08/10/2017   HIV antibody (routine testing) (NOT for [facility])     Status: None   Collection Time: 08/07/17 10:49 PM  Result Value Ref Range   HIV Screen 4th Generation wRfx Non Reactive Non Reactive    Comment: (NOTE) Performed At: [facility] [address] [place], [place] [id number] Mila Homer MD [id number]   Magnesium     Status: Abnormal   Collection Time: 08/09/17  2:19 PM  Result Value Ref Range   Magnesium 5.8 (H) 1.7 - 2.4  mg/dL    Imaging Studies:    [place] Mfm Ob Transvaginal  Result Date: 08/08/2017 ----------------------------------------------------------------------  OBSTETRICS REPORT                      (Signed Final 08/08/2017 01:10 pm) ---------------------------------------------------------------------- Patient Info  ID #:       [id number]                          D.O.B.:  1996/07/28 (20 yrs)  Name:       Beverly Santana               Visit Date: 08/07/2017 10:01 pm              FLEMING ---------------------------------------------------------------------- Performed By  Performed By:     Ellin Saba        Referred By:       MAU Nursing-                    RDMS                                      MAU/Triage  Attending:        Durwin Nora       Location:          [facility]                    MD ---------------------------------------------------------------------- Orders   #  Description                                 Code   1  [place] MFM OB TRANSVAGINAL                      [phone]  ----------------------------------------------------------------------   #  Ordered By               Order #  Accession #    Episode #   1  Judeth Horn            [id number]      [id number]     [id number]  ---------------------------------------------------------------------- Indications   [redacted] weeks gestation of pregnancy                Z3A.25   Preterm labor without delivery, second         O60.02   trimester   Poor obstetric history: Previous preterm       O09.219   delivery, antepartum 28 weeks   Cervical cerclage suture present, second       O34.32   trimester   Cervical insufficiency, 2nd                    O34.32   Encounter for cervical length                  Z36.86  ---------------------------------------------------------------------- OB History  Gravidity:    3         Prem:   1         SAB:   1  Living:       1 ---------------------------------------------------------------------- Fetal Evaluation  Num Of  Fetuses:     1  Fetal Heart         144  Rate(bpm):  Cardiac Activity:   Observed  Presentation:       Cephalic  Amniotic Fluid  AFI FV:      Subjectively within normal limits ---------------------------------------------------------------------- Gestational Age  Clinical EDD:  25w 6d                                        EDD:   11/14/17  Best:          25w 6d     Det. By:  Clinical EDD             EDD:   11/14/17 ---------------------------------------------------------------------- Cervix Uterus Adnexa  Cervix  Length:            2.1  cm.  Cerclage visualized. ---------------------------------------------------------------------- Impression  SIUP at [redacted]w[redacted]d  active fetus  cephalic presentation  amniotic fluid volume is gestational age appropriate  cervix is closed with a length of 2.1cm  cerclage is visualized and is intact ---------------------------------------------------------------------- Recommendations  Recommend clinical correlation with follow up/management  as clinically indicated. ----------------------------------------------------------------------               Durwin Nora, MD Electronically Signed Final Report   08/08/2017 01:10 pm ----------------------------------------------------------------------    Medications:  Scheduled . docusate sodium  100 mg Oral Daily  . metroNIDAZOLE  500 mg Oral Q12H  . NIFEdipine  30 mg Oral BID  . prenatal multivitamin  1 tablet Oral Q1200  . progesterone  200 mg Vaginal QHS   I have reviewed the patient's current medications.  ASSESSMENT: IUP 26 1/7 weeks PTL Incompetent cervix with cerclage in place  PLAN: Will obtain repeat transvaginal ultrasound to evaluate for possible further cervical shortening. Continue Prometrium, Nifedipine XR ordered Metronidazole ordered for BV. She is s/p magnesium sulfate and BMZ; reassuring FHR Appreciate SW consult.  Continue routine antenatal care.   Jaynie Collins, MD 08/10/2017,9:32 AM

## 2017-08-10 NOTE — Progress Notes (Signed)
Asked by RN to speak with patient, patient feeling okay but frustrated as she is feeling better and not sure of plan. Also complains of pain in right hand where IV infiltrated. She is feeling better and still having some minor cramping but overall contractions much improved. Reviewed that we will keep her for observation overnight for pre-term labor improved on procardia. She is agreeable to plan.   Baldemar Lenis, M.D. Attending Obstetrician & Gynecologist, [facility] for [employer], [facility]

## 2017-08-11 MED ORDER — METRONIDAZOLE 500 MG PO TABS: 500.0000 mg | ORAL_TABLET | Freq: Two times a day (BID) | ORAL | 0 refills | Status: DC

## 2017-08-11 MED ORDER — NIFEDIPINE ER 30 MG PO TB24: 30.0000 mg | ORAL_TABLET | Freq: Two times a day (BID) | ORAL | 2 refills | Status: DC

## 2017-08-11 MED ORDER — PRENATAL MULTIVITAMIN CH: 1.0000 | ORAL_TABLET | Freq: Every day | ORAL | 5 refills | Status: AC

## 2017-08-11 MED ORDER — PROGESTERONE MICRONIZED 200 MG PO CAPS: 200.0000 mg | ORAL_CAPSULE | Freq: Every day | ORAL | 3 refills | Status: DC

## 2017-08-11 MED ORDER — FLUCONAZOLE 150 MG PO TABS: 150.0000 mg | ORAL_TABLET | Freq: Once | ORAL | 3 refills | Status: AC

## 2017-08-11 MED ORDER — DOCUSATE SODIUM 100 MG PO CAPS: 100.0000 mg | ORAL_CAPSULE | Freq: Every day | ORAL | 2 refills | Status: AC | PRN

## 2017-08-11 NOTE — Discharge Instructions (Signed)
Preterm Labor and Birth Information The normal length of a pregnancy is 39-41 weeks. Preterm labor is when labor starts before 37 completed weeks of pregnancy. What are the risk factors for preterm labor? Preterm labor is more likely to occur in women who:  Have certain infections during pregnancy such as a bladder infection, sexually transmitted infection, or infection inside the uterus (chorioamnionitis).  Have a shorter-than-normal cervix.  Have gone into preterm labor before.  Have had surgery on their cervix.  Are younger than age 89 or older than age 19.  Are African American.  Are pregnant with twins or multiple babies (multiple gestation).  Take street drugs or smoke while pregnant.  Do not gain enough weight while pregnant.  Became pregnant shortly after having been pregnant.  What are the symptoms of preterm labor? Symptoms of preterm labor include:  Cramps similar to those that can happen during a menstrual period. The cramps may happen with diarrhea.  Pain in the abdomen or lower back.  Regular uterine contractions that may feel like tightening of the abdomen.  A feeling of increased pressure in the pelvis.  Increased watery or bloody mucus discharge from the vagina.  Water breaking (ruptured amniotic sac).  Why is it important to recognize signs of preterm labor? It is important to recognize signs of preterm labor because babies who are born prematurely may not be fully developed. This can put them at an increased risk for:  Long-term (chronic) heart and lung problems.  Difficulty immediately after birth with regulating body systems, including blood sugar, body temperature, heart rate, and breathing rate.  Bleeding in the brain.  Cerebral palsy.  Learning difficulties.  Death.  These risks are highest for babies who are born before 37 weeks of pregnancy. How is preterm labor treated? Treatment depends on the length of your pregnancy, your  condition, and the health of your baby. It may involve:  Having a stitch (suture) placed in your cervix to prevent your cervix from opening too early (cerclage).  Taking or being given medicines, such as: ? Hormone medicines. These may be given early in pregnancy to help support the pregnancy. ? Medicine to stop contractions. ? Medicines to help mature the babys lungs. These may be prescribed if the risk of delivery is high. ? Medicines to prevent your baby from developing cerebral palsy.  If the labor happens before 34 weeks of pregnancy, you may need to stay in the hospital. What should I do if I think I am in preterm labor? If you think that you are going into preterm labor, call your health care provider right away. How can I prevent preterm labor in future pregnancies? To increase your chance of having a full-term pregnancy:  Do not use any tobacco products, such as cigarettes, chewing tobacco, and e-cigarettes. If you need help quitting, ask your health care provider.  Do not use street drugs or medicines that have not been prescribed to you during your pregnancy.  Talk with your health care provider before taking any herbal supplements, even if you have been taking them regularly.  Make sure you gain a healthy amount of weight during your pregnancy.  Watch for infection. If you think that you might have an infection, get it checked right away.  Make sure to tell your health care provider if you have gone into preterm labor before.  This information is not intended to replace advice given to you by your health care provider. Make sure you discuss any questions  you have with your health care provider. Document Released: 01/17/2004 Document Revised: 04/08/2016 Document Reviewed: 03/19/2016 Elsevier Interactive Patient Education  2018 [employer].   Cervical Insufficiency Cervical insufficiency is when the cervix is weak and starts to open (dilate) and thin (efface) before the  pregnancy is at term and before labor starts. This is also called incompetent cervix. It can happen during the second or third trimester when the fetus starts putting pressure on the cervix. Treatment may reduce the risk of problems for you and your baby. Cervical insufficiency can lead to:  Loss of the baby (miscarriage).  Breaking of the sac that holds the baby (amniotic sac). This is also called preterm premature rupture of the membranes,PPROM.  The baby being born early (preterm birth).  What are the causes? The cause of this condition is not well known. However, it may be caused by abnormalities in the cervix and other factors such as inflammation or infection. What increases the risk? This condition is more likely to develop if:  You have a shorter cervix than normal.  Your cervix was damaged or injured during a past pregnancy or surgery.  You were born with a cervical defect.  You have had a procedure done on the cervix, such as cervical biopsy.  You have a history of: ? Cervical insufficiency. ? PPROM.  You have ended several pregnancies through abortion.  You were exposed to the drug diethylstilbestrol (DES).  What are the signs or symptoms? Symptoms of this condition can vary. Sometimes there no symptoms for this condition, and at other times there are mild symptoms that start between weeks 14 and 20 of pregnancy. The symptoms may last several days or weeks. These symptoms include:  Light spotting or bleeding from the vagina.  Pelvic pressure.  A change in vaginal discharge, such as changes from clear, white, or light yellow to pink or tan.  Back pain.  Abdominal pain or cramping.  How is this diagnosed? This condition may be diagnosed based on:  Your symptoms.  Your medical history, including: ? Any problems during past pregnancies, such as miscarriages. ? Any procedures performed on your cervix. ? Any history of cervical insufficiency.  During the  second trimester, cervical insufficiency may be diagnosed based on:  An ultrasound done with a probe inserted into your vagina (transvaginal ultrasound).  A pelvic exam.  Tests of fluid in the amniotic sac. This is done to rule out infection.  How is this treated? This condition may be managed by:  Limiting physical activity.  Limiting activity at home or in the hospital.  Pelvic rest. This means that there should be no sexual intercourse or placing anything in the vagina.  A procedure to sew the cervix closed and prevent it from opening too early (cerclage). The stitches (sutures) are removed between weeks 36 and 38 to avoid problems during labor. Cerclage may be recommended if: ? You have a history of miscarriages or preterm births without a known cause. ? You have a short cervix. A short cervix is identified by ultrasound. ? Your cervix has dilated before 24 weeks of pregnancy.  Follow these instructions at home:  Get plenty of rest and lessen activity as told by your health care provider. Ask your health care provider what activities are safe for you.  If pelvic rest was recommended, you shouldnot have sex, use tampons, douche, or place anything inside your vagina until your health care provider says that this is okay.  Take over-the-counter and prescription  medicines only as told by your health care provider.  Keep all follow-up visits and prenatal visits as told by your health care provider. This is important. Get help right away if:  You have vaginal bleeding, even if it is a small amount or even if it is painless.  You have pain in your abdomen or your lower back.  You have a feeling of increased pressure in your pelvis.  You have vaginal discharge that changes from clear, white, or light yellow to pink or tan.  You have a fever.  You have severe nausea or vomiting. Summary  Cervical insufficiency is when the cervix is weak and starts to dilate and efface before  the pregnancy is at term and before labor starts.  Symptoms of this condition can vary from no symptoms to mild symptoms that start between weeks 14 and 20 of pregnancy. The symptoms may last several days or weeks.  This condition may be managed by limiting physical activity, having pelvic rest, or having cervical cerclage.  If pelvic rest was recommended, you should not have sex, use tampons, use a douche, or place anything inside your vagina until your health care provider says that this is okay. This information is not intended to replace advice given to you by your health care provider. Make sure you discuss any questions you have with your health care provider. Document Released: 10/27/2005 Document Revised: 10/30/2016 Document Reviewed: 10/30/2016 Elsevier Interactive Patient Education  2017 [employer].

## 2017-08-11 NOTE — Discharge Summary (Signed)
Antenatal Physician Discharge Summary  Patient ID: Beverly Santana MRN: [id number] DOB/AGE: 05-03-1996 21 y.o.  Admit date: 08/07/2017 Discharge date: 08/11/2017  Admission and Discharge Diagnoses: Patient Active Problem List   Diagnosis Date Noted  . Short cervix with cervical cerclage, antepartum 08/10/2017  . Cervical incompetence, antepartum 08/10/2017  . Supervision of high risk pregnancy, antepartum 08/10/2017  . Preterm uterine contractions in second trimester, antepartum 08/08/2017   Prenatal Procedures: NST and ultrasound  Consults:  Maternal Fetal Medicine  Hospital Course:  This is a 21 y.o. [id number] with IUP at [redacted]w[redacted]d admitted for preterm contractions in the setting of known cervical incompetence with cerclage in place.  She just moved to [place]. Noted to have 2.1 cm cervical length on admission and preterm contractions; was given magnesium sulfate for tocolysis and CP prophylaxis; and also given Indocin for additional tocolysis.  She also received betamethasone x 2 doses.  She was also treated for BV.  On 08/10/17, she was transitioned to oral procardia for contraction control. Repeat scan showed 2.5 cm cervical length.  She was observed, fetal heart rate monitoring remained reassuring, and she had no signs/symptoms of progressing preterm labor or other maternal-fetal concerns.  Her cervical exam was unchanged from admission.  She was deemed stable for discharge to home with outpatient follow up.  Discharge Exam: Temp:  [98.4 F (36.9 C)-99 F (37.2 C)] 98.6 F (37 C) (10/02 0800) Pulse Rate:  [88-109] 91 (10/02 0851) Resp:  [16-18] 18 (10/02 0800) BP: (93-101)/(42-61) 99/53 (10/02 0851) SpO2:  [98 %-100 %] 100 % (10/02 0800) Physical Examination: CONSTITUTIONAL: Well-developed, well-nourished female in no acute distress.  HENT:  Normocephalic, atraumatic, External right and left ear normal. Oropharynx is clear and moist EYES: Conjunctivae and EOM are normal.  Pupils are equal, round, and reactive to light. No scleral icterus.  NECK: Normal range of motion, supple, no masses SKIN: Skin is warm and dry. No rash noted. Not diaphoretic. No erythema. No pallor. NEUROLGIC: Alert and oriented to person, place, and time. Normal reflexes, muscle tone coordination. No cranial nerve deficit noted. PSYCHIATRIC: Normal mood and affect. Normal behavior. Normal judgment and thought content. CARDIOVASCULAR: Normal heart rate noted, regular rhythm RESPIRATORY: Effort and breath sounds normal, no problems with respiration noted MUSCULOSKELETAL: Normal range of motion. No edema and no tenderness. 2+ distal pulses. ABDOMEN: Soft, nontender, nondistended, gravid. CERVIX: Dilation: Closed (long) Exam by:: Jaciel Diem  Fetal monitoring: FHR: 145 bpm, Variability: moderate, 10 x 10 Accelerations: Present, Decelerations: Absent  Uterine activity: Rare  Significant Diagnostic Studies:  Results for orders placed or performed during the hospital encounter of 08/07/17 (from the past 168 hour(s))  GC/Chlamydia probe amp ([place])not at [facility]   Collection Time: 08/07/17 12:00 AM  Result Value Ref Range   Chlamydia Negative    Neisseria gonorrhea Negative   Urinalysis, Routine w reflex microscopic   Collection Time: 08/07/17  8:53 PM  Result Value Ref Range   Color, Urine YELLOW YELLOW   APPearance CLEAR CLEAR   Specific Gravity, Urine 1.018 1.005 - 1.030   pH 6.0 5.0 - 8.0   Glucose, UA NEGATIVE NEGATIVE mg/dL   Hgb urine dipstick NEGATIVE NEGATIVE   Bilirubin Urine NEGATIVE NEGATIVE   Ketones, ur NEGATIVE NEGATIVE mg/dL   Protein, ur NEGATIVE NEGATIVE mg/dL   Nitrite NEGATIVE NEGATIVE   Leukocytes, UA SMALL (A) NEGATIVE   RBC / HPF 0-5 0 - 5 RBC/hpf   WBC, UA 0-5 0 - 5 WBC/hpf   Bacteria, UA RARE (  A) NONE SEEN   Squamous Epithelial / LPF 0-5 (A) NONE SEEN   Mucus PRESENT   Rapid urine drug screen (hospital performed)   Collection Time: 08/07/17  8:53 PM   Result Value Ref Range   Opiates NONE DETECTED NONE DETECTED   Cocaine NONE DETECTED NONE DETECTED   Benzodiazepines NONE DETECTED NONE DETECTED   Amphetamines NONE DETECTED NONE DETECTED   Tetrahydrocannabinol NONE DETECTED NONE DETECTED   Barbiturates NONE DETECTED NONE DETECTED  Wet prep, genital   Collection Time: 08/07/17  9:25 PM  Result Value Ref Range   Yeast Wet Prep HPF POC NONE SEEN NONE SEEN   Trich, Wet Prep NONE SEEN NONE SEEN   Clue Cells Wet Prep HPF POC PRESENT (A) NONE SEEN   WBC, Wet Prep HPF POC FEW (A) NONE SEEN   Sperm NONE SEEN   CBC   Collection Time: 08/07/17 10:49 PM  Result Value Ref Range   WBC 7.8 4.0 - 10.5 K/uL   RBC 3.57 (L) 3.87 - 5.11 MIL/uL   Hemoglobin 11.5 (L) 12.0 - 15.0 g/dL   HCT 16.1 (L) 09.6 - 04.5 %   MCV 89.9 78.0 - 100.0 fL   MCH 32.2 26.0 - 34.0 pg   MCHC 35.8 30.0 - 36.0 g/dL   RDW 40.9 81.1 - 91.4 %   Platelets 158 150 - 400 K/uL  HIV antibody (routine testing) (NOT for [facility]   Collection Time: 08/07/17 10:49 PM  Result Value Ref Range   HIV Screen 4th Generation wRfx Non Reactive Non Reactive  Type and screen   Collection Time: 08/07/17 10:49 PM  Result Value Ref Range   ABO/RH(D) B POS    Antibody Screen NEG    Sample Expiration 08/10/2017   Magnesium   Collection Time: 08/09/17  2:19 PM  Result Value Ref Range   Magnesium 5.8 (H) 1.7 - 2.4 mg/dL    Discharge Condition: Stable  Disposition: 01-Home or Self Care    Allergies as of 08/11/2017      Reactions   Orange Fruit [citrus] Anaphylaxis   Eyes also bulge   Sulfa Antibiotics Anaphylaxis   Cefzil [cefprozil]    Unknown reaction   Latex Swelling   Reglan [metoclopramide]    Unknown reaction   Trileptal [oxcarbazepine]    Unknown reaction      Medication List    TAKE these medications   acetaminophen 500 MG tablet Commonly known as:  TYLENOL Take 500 mg by mouth every 6 (six) hours as needed for mild pain or headache.   albuterol 108 (90 Base)  MCG/ACT inhaler Commonly known as:  PROVENTIL HFA;VENTOLIN HFA Inhale 1-2 puffs into the lungs every 6 (six) hours as needed for wheezing or shortness of breath.   docusate sodium 100 MG capsule Commonly known as:  COLACE Take 1 capsule (100 mg total) by mouth daily as needed for mild constipation.   metroNIDAZOLE 500 MG tablet Commonly known as:  FLAGYL Take 1 tablet (500 mg total) by mouth 2 (two) times daily.   NIFEdipine 30 MG 24 hr tablet Commonly known as:  PROCARDIA-XL/ADALAT CC Take 1 tablet (30 mg total) by mouth 2 (two) times daily.   prenatal multivitamin Tabs tablet Take 1 tablet by mouth daily at 12 noon.   progesterone 200 MG capsule Commonly known as:  PROMETRIUM Place 1 capsule (200 mg total) vaginally at bedtime.      Follow-up Information    Merlie Noga, Jethro Bastos, MD Follow up on 08/31/2017.  Specialty:  Obstetrics and Gynecology Why:  9:20 am for initial visit, 2 hr GTT Contact information: [address] [place] [address] [phone]           Signed: Jaynie Collins M.D. 08/11/2017, 9:26 AM

## 2017-08-31 ENCOUNTER — Encounter: Payer: Medicaid Other | Admitting: Obstetrics & Gynecology

## 2017-09-04 ENCOUNTER — Encounter: Payer: Self-pay | Admitting: Advanced Practice Midwife

## 2017-09-04 ENCOUNTER — Ambulatory Visit (INDEPENDENT_AMBULATORY_CARE_PROVIDER_SITE_OTHER): Payer: Medicaid Other | Admitting: Advanced Practice Midwife

## 2017-09-04 VITALS — BP 103/53 | HR 91 | Wt 106.1 lb

## 2017-09-04 DIAGNOSIS — O2613 Low weight gain in pregnancy, third trimester: Secondary | ICD-10-CM | POA: Diagnosis not present

## 2017-09-04 DIAGNOSIS — O09219 Supervision of pregnancy with history of pre-term labor, unspecified trimester: Secondary | ICD-10-CM

## 2017-09-04 DIAGNOSIS — O26879 Cervical shortening, unspecified trimester: Secondary | ICD-10-CM

## 2017-09-04 DIAGNOSIS — O0990 Supervision of high risk pregnancy, unspecified, unspecified trimester: Secondary | ICD-10-CM

## 2017-09-04 DIAGNOSIS — O3430 Maternal care for cervical incompetence, unspecified trimester: Secondary | ICD-10-CM

## 2017-09-04 DIAGNOSIS — T7491XA Unspecified adult maltreatment, confirmed, initial encounter: Secondary | ICD-10-CM

## 2017-09-04 DIAGNOSIS — O4702 False labor before 37 completed weeks of gestation, second trimester: Secondary | ICD-10-CM

## 2017-09-04 DIAGNOSIS — O09899 Supervision of other high risk pregnancies, unspecified trimester: Secondary | ICD-10-CM | POA: Insufficient documentation

## 2017-09-04 DIAGNOSIS — Z363 Encounter for antenatal screening for malformations: Secondary | ICD-10-CM | POA: Diagnosis not present

## 2017-09-04 LAB — POCT URINALYSIS DIP (DEVICE)
Bilirubin Urine: NEGATIVE
GLUCOSE, UA: NEGATIVE mg/dL
Hgb urine dipstick: NEGATIVE
Ketones, ur: NEGATIVE mg/dL
Leukocytes, UA: NEGATIVE
Nitrite: NEGATIVE
PH: 7 (ref 5.0–8.0)
PROTEIN: NEGATIVE mg/dL
Specific Gravity, Urine: 1.015 (ref 1.005–1.030)
UROBILINOGEN UA: 0.2 mg/dL (ref 0.0–1.0)

## 2017-09-04 MED ORDER — ENSURE ORIGINAL PO LIQD: 1.0000 | Freq: Every day | ORAL | 3 refills | Status: AC | PRN

## 2017-09-04 MED ORDER — PROGESTERONE MICRONIZED 200 MG PO CAPS: 200.0000 mg | ORAL_CAPSULE | Freq: Every day | ORAL | 3 refills | Status: DC

## 2017-09-04 NOTE — Progress Notes (Signed)
Here for initial prenatal visit, [place]. C/o "seeing stars : right now and off and on at times. V.S . Checked. C/o swelling in her vagina. States stopped taking procardia 3 days ago because of the way it was making her feel- bad headache and seeing black dots. Especially if got up fast. C/o spotting bright red 3 days ago. Declines flu shot and tdap shots. Enrolled in [employer] app. PMH form completed.
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
## 2017-09-04 NOTE — Patient Instructions (Signed)
Pregnancy and Influenza Influenza, also called the flu, is an infection of the respiratory tract. If you are pregnant, you are more likely to catch the flu. You are also more likely to have a more serious case of the flu. This is because pregnancy lowers your body's ability to fight off infections (it weakens your immune system). It also puts additional stress on your heart and lungs, which makes you more likely to have complications. Having a bad case of the flu, especially with a high fever, can be dangerous for your developing baby. It can cause you to go into early labor. How do people get the flu? The flu is caused by the influenza virus. This virus is common every year in the fall and winter. It spreads when virus particles get passed from person to person. You can get the virus if you are near a sick person who is coughing or sneezing. You can also get the virus if you touch something that has the virus on it and then touch your face. How can I protect myself against the flu?  Get a flu shot. The best way to prevent the flu is to get a flu shot before flu season starts. The flu shot is not dangerous for your developing baby. It may even help protect your baby from the flu for up to 6 months after birth. The flu shot is one type of flu vaccine. Another type is a nasal spray vaccine. Do not get the nasal spray vaccine. It is not approved for pregnancy.  Do not come in close contact with sick people.  Do not share food, drinks, or utensils with other people.  Wash your hands often. Use hand sanitizer when soap and water are not available. What should I do if I have flu symptoms? If you have any flu symptoms, call your health care provider right away. Flu symptoms include:  Fever or chills.  Muscle aches.  Headache.  Sore throat.  Nasal congestion.  Cough.  Feeling tired.  Loss of appetite.  Vomiting.  Diarrhea.  You may be able to take an antiviral medicine to keep the flu  from becoming severe and to shorten how long it lasts. What should I do at home if I am diagnosed with the flu?  Do not take any medicine, including cold or flu medicine, unless directed by your health care provider.  If you take antiviral medicine, make sure you finish it even if you start to feel better.  Drink enough fluid to keep your urine clear or pale yellow.  Get plenty of rest. When would I seek immediate medical care if I have the flu?  You have trouble breathing.  You have chest pain.  You begin to have labor pains.  You have a high fever that does not go down after you take medicine.  You do not feel your baby move.  You have diarrhea or vomiting that will not go away. This information is not intended to replace advice given to you by your health care provider. Make sure you discuss any questions you have with your health care provider. Document Released: 08/29/2008 Document Revised: 04/03/2016 Document Reviewed: 09/23/2013 Elsevier Interactive Patient Education  2017 [employer].  Tdap Vaccine (Tetanus, Diphtheria and Pertussis): What You Need to Know 1. Why get vaccinated? Tetanus, diphtheria and pertussis are very serious diseases. Tdap vaccine can protect [place] from these diseases. And, Tdap vaccine given to pregnant women can protect newborn babies against pertussis. TETANUS (Lockjaw)  is rare in the [place] today. It causes painful muscle tightening and stiffness, usually all over the body. It can lead to tightening of muscles in the head and neck so you can't open your mouth, swallow, or sometimes even breathe. Tetanus kills about 1 out of 10 people who are infected even after receiving the best medical care.  DIPHTHERIA is also rare in the [place] today. It can cause a thick coating to form in the back of the throat. It can lead to breathing problems, heart failure, paralysis, and death.  PERTUSSIS (Whooping Cough) causes severe coughing spells, which  can cause difficulty breathing, vomiting and disturbed sleep. It can also lead to weight loss, incontinence, and rib fractures. Up to 2 in 100 adolescents and 5 in 100 adults with pertussis are hospitalized or have complications, which could include pneumonia or death.  These diseases are caused by bacteria. Diphtheria and pertussis are spread from person to person through secretions from coughing or sneezing. Tetanus enters the body through cuts, scratches, or wounds. Before vaccines, as many as 200,000 cases of diphtheria, 200,000 cases of pertussis, and hundreds of cases of tetanus, were reported in the [place] each year. Since vaccination began, reports of cases for tetanus and diphtheria have dropped by about 99% and for pertussis by about 80%. 2. Tdap vaccine Tdap vaccine can protect adolescents and adults from tetanus, diphtheria, and pertussis. One dose of Tdap is routinely given at age 69 or 32. People who did not get Tdap at that age should get it as soon as possible. Tdap is especially important for healthcare professionals and anyone having close contact with a baby younger than 12 months. Pregnant women should get a dose of Tdap during every pregnancy, to protect the newborn from pertussis. Infants are most at risk for severe, life-threatening complications from pertussis. Another vaccine, called Td, protects against tetanus and diphtheria, but not pertussis. A Td booster should be given every 10 years. Tdap may be given as one of these boosters if you have never gotten Tdap before. Tdap may also be given after a severe cut or burn to prevent tetanus infection. Your doctor or the person giving you the vaccine can give you more information. Tdap may safely be given at the same time as other vaccines. 3. Some people should not get this vaccine A person who has ever had a life-threatening allergic reaction after a previous dose of any diphtheria, tetanus or pertussis containing vaccine,  OR has a severe allergy to any part of this vaccine, should not get Tdap vaccine. Tell the person giving the vaccine about any severe allergies. Anyone who had coma or long repeated seizures within 7 days after a childhood dose of DTP or DTaP, or a previous dose of Tdap, should not get Tdap, unless a cause other than the vaccine was found. They can still get Td. Talk to your doctor if you: have seizures or another nervous system problem, had severe pain or swelling after any vaccine containing diphtheria, tetanus or pertussis, ever had a condition called Guillain-Barr Syndrome (GBS), aren't feeling well on the day the shot is scheduled. 4. Risks With any medicine, including vaccines, there is a chance of side effects. These are usually mild and go away on their own. Serious reactions are also possible but are rare. Most people who get Tdap vaccine do not have any problems with it. Mild problems following Tdap: (Did not interfere with activities) Pain where the shot was given (about  3 in 4 adolescents or 2 in 3 adults) Redness or swelling where the shot was given (about 1 person in 5) Mild fever of at least 100.56F (up to about 1 in 25 adolescents or 1 in 100 adults) Headache (about 3 or 4 people in 10) Tiredness (about 1 person in 3 or 4) Nausea, vomiting, diarrhea, stomach ache (up to 1 in 4 adolescents or 1 in 10 adults) Chills, sore joints (about 1 person in 10) Body aches (about 1 person in 3 or 4) Rash, swollen glands (uncommon)  Moderate problems following Tdap: (Interfered with activities, but did not require medical attention) Pain where the shot was given (up to 1 in 5 or 6) Redness or swelling where the shot was given (up to about 1 in 16 adolescents or 1 in 12 adults) Fever over 102F (about 1 in 100 adolescents or 1 in 250 adults) Headache (about 1 in 7 adolescents or 1 in 10 adults) Nausea, vomiting, diarrhea, stomach ache (up to 1 or 3 people in 100) Swelling of the entire  arm where the shot was given (up to about 1 in 500).  Severe problems following Tdap: (Unable to perform usual activities; required medical attention) Swelling, severe pain, bleeding and redness in the arm where the shot was given (rare).  Problems that could happen after any vaccine: People sometimes faint after a medical procedure, including vaccination. Sitting or lying down for about 15 minutes can help prevent fainting, and injuries caused by a fall. Tell your doctor if you feel dizzy, or have vision changes or ringing in the ears. Some people get severe pain in the shoulder and have difficulty moving the arm where a shot was given. This happens very rarely. Any medication can cause a severe allergic reaction. Such reactions from a vaccine are very rare, estimated at fewer than 1 in a million doses, and would happen within a few minutes to a few hours after the vaccination. As with any medicine, there is a very remote chance of a vaccine causing a serious injury or death. The safety of vaccines is always being monitored. For more information, visit: http://floyd.org/www.cdc.gov/vaccinesafety/ 5. What if there is a serious problem? What should I look for? Look for anything that concerns you, such as signs of a severe allergic reaction, very high fever, or unusual behavior. Signs of a severe allergic reaction can include hives, swelling of the face and throat, difficulty breathing, a fast heartbeat, dizziness, and weakness. These would usually start a few minutes to a few hours after the vaccination. What should I do? If you think it is a severe allergic reaction or other emergency that can't wait, call 9-1-1 or get the person to the nearest hospital. Otherwise, call your doctor. Afterward, the reaction should be reported to the Vaccine Adverse Event Reporting System (VAERS). Your doctor might file this report, or you can do it yourself through the VAERS web site at www.vaers.LAgents.nohhs.gov, or by calling  1-2488290869. VAERS does not give medical advice. 6. The National Vaccine Injury Compensation Program The [employer] Vaccine Injury Compensation Program (VICP) is a federal program that was created to compensate people who may have been injured by certain vaccines. Persons who believe they may have been injured by a vaccine can learn about the program and about filing a claim by calling 1-(989)755-8017 or visiting the VICP website at SpiritualWord.atwww.hrsa.gov/vaccinecompensation. There is a time limit to file a claim for compensation. 7. How can I learn more? Ask your doctor. He or she  can give you the vaccine package insert or suggest other sources of information. Call your local or state health department. Contact the Centers for Disease Control and Prevention (CDC): Call (404)537-3903 (1-800-CDC-INFO) or Visit CDC's website at PicCapture.uy CDC Tdap Vaccine VIS (01/03/14) This information is not intended to replace advice given to you by your health care provider. Make sure you discuss any questions you have with your health care provider. Document Released: 04/27/2012 Document Revised: 07/17/2016 Document Reviewed: 07/17/2016 Elsevier Interactive Patient Education  2017 [employer].

## 2017-09-05 LAB — CBC
HEMATOCRIT: 33.7 % — AB (ref 34.0–46.6)
HEMOGLOBIN: 11.5 g/dL (ref 11.1–15.9)
MCH: 32 pg (ref 26.6–33.0)
MCHC: 34.1 g/dL (ref 31.5–35.7)
MCV: 94 fL (ref 79–97)
Platelets: 161 10*3/uL (ref 150–379)
RBC: 3.59 x10E6/uL — ABNORMAL LOW (ref 3.77–5.28)
RDW: 13.6 % (ref 12.3–15.4)
WBC: 7.4 10*3/uL (ref 3.4–10.8)

## 2017-09-05 LAB — GLUCOSE TOLERANCE, 2 HOURS W/ 1HR
GLUCOSE, 2 HOUR: 122 mg/dL (ref 65–152)
Glucose, 1 hour: 122 mg/dL (ref 65–179)
Glucose, Fasting: 56 mg/dL — ABNORMAL LOW (ref 65–91)

## 2017-09-05 LAB — RPR: RPR Ser Ql: NONREACTIVE

## 2017-09-05 LAB — HIV ANTIBODY (ROUTINE TESTING W REFLEX): HIV SCREEN 4TH GENERATION: NONREACTIVE

## 2017-09-08 ENCOUNTER — Encounter (HOSPITAL_COMMUNITY): Payer: Self-pay | Admitting: Advanced Practice Midwife

## 2017-09-08 ENCOUNTER — Telehealth: Payer: Self-pay | Admitting: Obstetrics and Gynecology

## 2017-09-08 NOTE — Telephone Encounter (Signed)
Patient is calling to get a letter stating she has a medical reason she needs to get ensure.

## 2017-09-10 ENCOUNTER — Telehealth: Payer: Self-pay

## 2017-09-10 NOTE — Telephone Encounter (Signed)
[facility] and [facility] the medical reason for her to receive Ensure.  Fax # given [phone] and pt requested to have a return call back.

## 2017-09-17 ENCOUNTER — Ambulatory Visit (HOSPITAL_COMMUNITY)
Admission: RE | Admit: 2017-09-17 | Discharge: 2017-09-17 | Disposition: A | Discharge status: Home / Self Care | Payer: Medicaid Other | Source: Ambulatory Visit | Attending: Advanced Practice Midwife | Admitting: Advanced Practice Midwife

## 2017-09-17 ENCOUNTER — Encounter (HOSPITAL_COMMUNITY): Payer: Self-pay

## 2017-09-17 ENCOUNTER — Other Ambulatory Visit: Payer: Self-pay | Admitting: Advanced Practice Midwife

## 2017-09-17 ENCOUNTER — Ambulatory Visit (INDEPENDENT_AMBULATORY_CARE_PROVIDER_SITE_OTHER): Payer: Medicaid Other | Admitting: Student

## 2017-09-17 VITALS — BP 98/60 | HR 86 | Wt 109.0 lb

## 2017-09-17 DIAGNOSIS — O0993 Supervision of high risk pregnancy, unspecified, third trimester: Secondary | ICD-10-CM | POA: Insufficient documentation

## 2017-09-17 DIAGNOSIS — O3432 Maternal care for cervical incompetence, second trimester: Secondary | ICD-10-CM | POA: Diagnosis not present

## 2017-09-17 DIAGNOSIS — O09219 Supervision of pregnancy with history of pre-term labor, unspecified trimester: Secondary | ICD-10-CM | POA: Diagnosis present

## 2017-09-17 DIAGNOSIS — O2613 Low weight gain in pregnancy, third trimester: Secondary | ICD-10-CM | POA: Diagnosis not present

## 2017-09-17 DIAGNOSIS — O3430 Maternal care for cervical incompetence, unspecified trimester: Secondary | ICD-10-CM | POA: Insufficient documentation

## 2017-09-17 DIAGNOSIS — O09899 Supervision of other high risk pregnancies, unspecified trimester: Secondary | ICD-10-CM

## 2017-09-17 DIAGNOSIS — O26879 Cervical shortening, unspecified trimester: Secondary | ICD-10-CM

## 2017-09-17 DIAGNOSIS — Z363 Encounter for antenatal screening for malformations: Secondary | ICD-10-CM | POA: Diagnosis not present

## 2017-09-17 DIAGNOSIS — O0990 Supervision of high risk pregnancy, unspecified, unspecified trimester: Secondary | ICD-10-CM

## 2017-09-17 DIAGNOSIS — O26873 Cervical shortening, third trimester: Secondary | ICD-10-CM | POA: Diagnosis not present

## 2017-09-17 DIAGNOSIS — Z3686 Encounter for antenatal screening for cervical length: Secondary | ICD-10-CM | POA: Insufficient documentation

## 2017-09-17 DIAGNOSIS — Z3689 Encounter for other specified antenatal screening: Secondary | ICD-10-CM | POA: Insufficient documentation

## 2017-09-17 DIAGNOSIS — Z3A31 31 weeks gestation of pregnancy: Secondary | ICD-10-CM | POA: Diagnosis not present

## 2017-09-17 NOTE — Telephone Encounter (Signed)
[facility]  This was sent during patient's OB visit.

## 2017-09-17 NOTE — Patient Instructions (Signed)
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
## 2017-09-18 ENCOUNTER — Telehealth: Payer: Self-pay

## 2017-09-18 ENCOUNTER — Encounter: Payer: Self-pay | Admitting: Student

## 2017-09-18 ENCOUNTER — Other Ambulatory Visit: Payer: Self-pay | Admitting: Student

## 2017-09-18 ENCOUNTER — Telehealth: Payer: Self-pay | Admitting: Student

## 2017-09-18 DIAGNOSIS — Z3493 Encounter for supervision of normal pregnancy, unspecified, third trimester: Secondary | ICD-10-CM

## 2017-09-18 DIAGNOSIS — R630 Anorexia: Secondary | ICD-10-CM | POA: Insufficient documentation

## 2017-09-18 LAB — HEPATITIS B SURFACE ANTIBODY, QUANTITATIVE

## 2017-09-18 LAB — RUBELLA SCREEN

## 2017-09-18 NOTE — Telephone Encounter (Signed)
Spoke with pt and she is aware that I [facility] Ensure. Pt is also aware that she will have an appt on Monday 09/21/17 at 4:00 at [facility].

## 2017-09-18 NOTE — Telephone Encounter (Signed)
Called patient abotu her lab work; patient will come in on 09-21-2017 for Ob Urine culture and HepBSag.  -[facility] to request that patient be given Ensure prescription. Explained that patient has low weight gain in pregnancy and malnutrition which would affect the health of the mother and fetus.

## 2017-09-21 ENCOUNTER — Inpatient Hospital Stay (HOSPITAL_COMMUNITY)
Admission: AD | Admit: 2017-09-21 | Discharge: 2017-09-21 | Disposition: A | Discharge status: Home / Self Care | Payer: Medicaid Other | Source: Ambulatory Visit | Attending: Family Medicine | Admitting: Family Medicine

## 2017-09-21 ENCOUNTER — Other Ambulatory Visit: Payer: Medicaid Other

## 2017-09-21 ENCOUNTER — Encounter (HOSPITAL_COMMUNITY): Payer: Self-pay | Admitting: *Deleted

## 2017-09-21 DIAGNOSIS — O3430 Maternal care for cervical incompetence, unspecified trimester: Secondary | ICD-10-CM

## 2017-09-21 DIAGNOSIS — O26873 Cervical shortening, third trimester: Secondary | ICD-10-CM | POA: Insufficient documentation

## 2017-09-21 DIAGNOSIS — Z87891 Personal history of nicotine dependence: Secondary | ICD-10-CM | POA: Diagnosis not present

## 2017-09-21 DIAGNOSIS — N760 Acute vaginitis: Secondary | ICD-10-CM

## 2017-09-21 DIAGNOSIS — Z3A32 32 weeks gestation of pregnancy: Secondary | ICD-10-CM | POA: Diagnosis not present

## 2017-09-21 DIAGNOSIS — O26879 Cervical shortening, unspecified trimester: Secondary | ICD-10-CM

## 2017-09-21 DIAGNOSIS — O23593 Infection of other part of genital tract in pregnancy, third trimester: Secondary | ICD-10-CM | POA: Diagnosis not present

## 2017-09-21 DIAGNOSIS — N898 Other specified noninflammatory disorders of vagina: Secondary | ICD-10-CM | POA: Diagnosis present

## 2017-09-21 DIAGNOSIS — B370 Candidal stomatitis: Secondary | ICD-10-CM | POA: Insufficient documentation

## 2017-09-21 DIAGNOSIS — O4703 False labor before 37 completed weeks of gestation, third trimester: Secondary | ICD-10-CM

## 2017-09-21 DIAGNOSIS — Z0371 Encounter for suspected problem with amniotic cavity and membrane ruled out: Secondary | ICD-10-CM

## 2017-09-21 DIAGNOSIS — B9689 Other specified bacterial agents as the cause of diseases classified elsewhere: Secondary | ICD-10-CM | POA: Diagnosis not present

## 2017-09-21 DIAGNOSIS — O0990 Supervision of high risk pregnancy, unspecified, unspecified trimester: Secondary | ICD-10-CM

## 2017-09-21 LAB — AMNISURE RUPTURE OF MEMBRANE (ROM) NOT AT ARMC: AMNISURE: NEGATIVE

## 2017-09-21 LAB — URINALYSIS, ROUTINE W REFLEX MICROSCOPIC
BILIRUBIN URINE: NEGATIVE
GLUCOSE, UA: NEGATIVE mg/dL
HGB URINE DIPSTICK: NEGATIVE
KETONES UR: NEGATIVE mg/dL
NITRITE: NEGATIVE
PROTEIN: NEGATIVE mg/dL
Specific Gravity, Urine: 1.016 (ref 1.005–1.030)
pH: 7 (ref 5.0–8.0)

## 2017-09-21 LAB — WET PREP, GENITAL
Sperm: NONE SEEN
Trich, Wet Prep: NONE SEEN
Yeast Wet Prep HPF POC: NONE SEEN

## 2017-09-21 MED ORDER — MAGIC MOUTHWASH: 5.0000 mL | Freq: Three times a day (TID) | ORAL | 0 refills | Status: AC | PRN

## 2017-09-21 MED ORDER — NYSTATIN 100000 UNIT/ML MT SUSP: 5.0000 mL | Freq: Four times a day (QID) | OROMUCOSAL | 0 refills | Status: AC

## 2017-09-21 MED ORDER — METRONIDAZOLE 0.75 % VA GEL: 1.0000 | Freq: Every day | VAGINAL | 1 refills | Status: AC

## 2017-09-21 NOTE — Progress Notes (Unsigned)
Patient presents to clinic for blood draw. Per phlebotomist, patient has questions for nurse. Patient c/o contractions irregular since Saturday. She has a cerclage. Has been having clear-white watery discharge which she says is enough to soak through her panties. Also has noticed small amt vaginal bleeding. After discussing with Dr Alysia PennaErvin, patient was directed to go to [facility] for further evaluation. Patient voiced understanding.

## 2017-09-21 NOTE — MAU Provider Note (Signed)
Chief Complaint:  Contractions; Vaginal Discharge; and Vaginal Bleeding   First Provider Initiated Contact with Patient 09/21/17 1859      HPI: Beverly Santana is a 21 y.o. [id number] at 2466w2d who presents to maternity admissions reporting 3 days of irregular cramping/contractions/pelvic pressure, worse when walking or standing. The pain resolves when lying down. It is more on the right than left and radiates to her right lower back. The pain is associated with white thin discharge enough to soak her underwear intermittently x 3 days. She saw some scant light red bleeding 3 days ago but none yesterday or today. She has cerclage placed in [place] at 14 weeks for hx shortening cervix/preterm delivery at 28 weeks.  Cervix was 2.5 cm at 26 weeks with this pregnancy.  She has not tried any treatments.  There are no other associated symptoms but she has recurrent thrush that was treated earlier in the pregnancy with white tongue that burns when she eats. She reports good fetal movement, denies vaginal bleeding, vaginal itching/burning, urinary symptoms, h/a, dizziness, n/v, or fever/chills.    HPI  Past Medical History: Past Medical History:  Diagnosis Date  . Asthma   . Preterm labor     Past obstetric history: OB History  Gravida Para Term Preterm AB Living  3 1 0 1 1 1   SAB TAB Ectopic Multiple Live Births  1       1    # Outcome Date GA Lbr Len/2nd Weight Sex Delivery Anes PTL Lv  3 Current           2 Preterm 11/20/14 1046w0d   F Vag-Spont   LIV     Birth Comments: PTL  1 SAB 2016              Past Surgical History: Past Surgical History:  Procedure Laterality Date  . CERVICAL CERCLAGE    . CYST EXCISION    . CYST REMOVAL HAND      Family History: Family History  Problem Relation Age of Onset  . Stroke Father     Social History: Social History   Tobacco Use  . Smoking status: Former Smoker    Types: Cigarettes    Last attempt to quit: 03/10/2017    Years since  quitting: 0.5  . Smokeless tobacco: Never Used  Substance Use Topics  . Alcohol use: Yes    Comment: occasionally before pregnancy  . Drug use: No    Allergies:  Allergies  Allergen Reactions  . Orange Fruit [Citrus] Anaphylaxis    Eyes also bulge  . Shellfish Allergy Swelling  . Sulfa Antibiotics Anaphylaxis  . Cefzil [Cefprozil]     Unknown reaction  . Latex Swelling  . Reglan [Metoclopramide]     Unknown reaction  . Trileptal [Oxcarbazepine]     Unknown reaction    Meds:  Medications Prior to Admission  Medication Sig Dispense Refill Last Dose  . acetaminophen (TYLENOL) 500 MG tablet Take 500 mg by mouth every 6 (six) hours as needed for mild pain or headache.   Taking  . albuterol (PROVENTIL HFA;VENTOLIN HFA) 108 (90 Base) MCG/ACT inhaler Inhale 1-2 puffs into the lungs every 6 (six) hours as needed for wheezing or shortness of breath.   Taking  . docusate sodium (COLACE) 100 MG capsule Take 1 capsule (100 mg total) by mouth daily as needed for mild constipation. (Patient not taking: Reported on 09/17/2017) 30 capsule 2 Not Taking  . Nutritional Supplements (ENSURE ORIGINAL) LIQD Take  1 Can by mouth daily as needed. (Patient not taking: Reported on 09/17/2017) 30 Bottle 3 Not Taking  . Prenatal Vit-Fe Fumarate-FA (PRENATAL MULTIVITAMIN) TABS tablet Take 1 tablet by mouth daily at 12 noon. 30 tablet 5 Taking  . progesterone (PROMETRIUM) 200 MG capsule Place 1 capsule (200 mg total) vaginally at bedtime. 30 capsule 3 Taking    ROS:  Review of Systems  Constitutional: Negative for chills, fatigue and fever.  Eyes: Negative for visual disturbance.  Respiratory: Negative for shortness of breath.   Cardiovascular: Negative for chest pain.  Gastrointestinal: Negative for abdominal pain, nausea and vomiting.  Genitourinary: Positive for pelvic pain. Negative for difficulty urinating, dysuria, flank pain, vaginal bleeding, vaginal discharge and vaginal pain.  Musculoskeletal:  Positive for back pain.  Neurological: Negative for dizziness and headaches.  Psychiatric/Behavioral: Negative.      I have reviewed patient's Past Medical Hx, Surgical Hx, Family Hx, Social Hx, medications and allergies.   Physical Exam   Patient Vitals for the past 24 hrs:  BP Temp Temp src Pulse Resp Weight  09/21/17 1957 93/64 98.2 F (36.8 C) Oral 92 17 -  09/21/17 1711 103/64 98.2 F (36.8 C) Oral (!) 111 18 -  09/21/17 1703 - - - - - 111 lb 1.9 oz (50.4 kg)   Constitutional: Well-developed, well-nourished female in no acute distress.  Cardiovascular: normal rate Respiratory: normal effort GI: Abd soft, non-tender, gravid appropriate for gestational age.  MS: Extremities nontender, no edema, normal ROM Neurologic: Alert and oriented x 4.  GU: Neg CVAT.  PELVIC EXAM: Cervix pink, visually closed, without lesion, cerclage in place, appears normal with no pulling or tearing, moderate amount thin milky discharge, vaginal walls and external genitalia normal   Dilation: Closed Effacement (%): 70 Cervical Position: Posterior Exam by:: L. Leftwich-Kirby CNM  FHT:  Baseline 135 , moderate variability, accelerations present, no decelerations Contractions: 3 in 1 hour, mild to palpation   Labs: Results for orders placed or performed during the hospital encounter of 09/21/17 (from the past 24 hour(s))  Amnisure rupture of membrane (rom)not at [facility]     Status: None   Collection Time: 09/21/17  6:53 PM  Result Value Ref Range   Amnisure ROM NEGATIVE   Wet prep, genital     Status: Abnormal   Collection Time: 09/21/17  6:53 PM  Result Value Ref Range   Yeast Wet Prep HPF POC NONE SEEN NONE SEEN   Trich, Wet Prep NONE SEEN NONE SEEN   Clue Cells Wet Prep HPF POC PRESENT (A) NONE SEEN   WBC, Wet Prep HPF POC MANY (A) NONE SEEN   Sperm NONE SEEN   Urinalysis, Routine w reflex microscopic     Status: Abnormal   Collection Time: 09/21/17  6:57 PM  Result Value Ref Range    Color, Urine YELLOW YELLOW   APPearance CLOUDY (A) CLEAR   Specific Gravity, Urine 1.016 1.005 - 1.030   pH 7.0 5.0 - 8.0   Glucose, UA NEGATIVE NEGATIVE mg/dL   Hgb urine dipstick NEGATIVE NEGATIVE   Bilirubin Urine NEGATIVE NEGATIVE   Ketones, ur NEGATIVE NEGATIVE mg/dL   Protein, ur NEGATIVE NEGATIVE mg/dL   Nitrite NEGATIVE NEGATIVE   Leukocytes, UA TRACE (A) NEGATIVE   RBC / HPF 0-5 0 - 5 RBC/hpf   WBC, UA 6-30 0 - 5 WBC/hpf   Bacteria, UA RARE (A) NONE SEEN   Squamous Epithelial / LPF 0-5 (A) NONE SEEN   Mucus PRESENT    --/--/  B POS (09/28 2249)  Imaging:    MAU Course/MDM: I have ordered labs and reviewed results.  NST reviewed and reactive Cerclage in place with closed cervix, cerclage visually intact with no pulling or tearing Amnisure negative so no evidence of ROM Will treat clue cells and increased discharge as BV with Metrogel per pt choice.  Nystatin and magic mouthwash for thrush.   Pt to f/u in office as scheduled Pt discharge with strict preterm labor precautions. Today's evaluation included a work-up for preterm labor which can be life-threatening for both mom and baby.  Assessment: 1. Threatened preterm labor, third trimester   2. Short cervix with cervical cerclage, antepartum   3. Encounter for suspected premature rupture of amniotic membranes, with rupture of membranes not found   4. BV (bacterial vaginosis)   5. Oral thrush     Plan: Discharge home Labor precautions and fetal kick counts Follow-up Information    Center for [facility] Follow up.   Specialty:  Obstetrics and Gynecology Why:  As scheduled, return to MAU as needed for emergencies. Contact information: [address] [place] [address] [phone]         Allergies as of 09/21/2017      Reactions   Orange Fruit [citrus] Anaphylaxis   Eyes also bulge   Shellfish Allergy Swelling   Sulfa Antibiotics Anaphylaxis   Cefzil [cefprozil]     Unknown reaction   Latex Swelling   Reglan [metoclopramide]    Unknown reaction   Trileptal [oxcarbazepine]    Unknown reaction      Medication List    TAKE these medications   acetaminophen 500 MG tablet Commonly known as:  TYLENOL Take 500 mg by mouth every 6 (six) hours as needed for mild pain or headache.   albuterol 108 (90 Base) MCG/ACT inhaler Commonly known as:  PROVENTIL HFA;VENTOLIN HFA Inhale 1-2 puffs into the lungs every 6 (six) hours as needed for wheezing or shortness of breath.   docusate sodium 100 MG capsule Commonly known as:  COLACE Take 1 capsule (100 mg total) by mouth daily as needed for mild constipation.   ENSURE ORIGINAL Liqd Take 1 Can by mouth daily as needed.   magic mouthwash Soln Take 5 mLs 3 (three) times daily as needed by mouth for mouth pain.   metroNIDAZOLE 0.75 % vaginal gel Commonly known as:  METROGEL Place 1 Applicatorful at bedtime vaginally. Apply one applicatorful to vagina at bedtime for 5 days   nystatin 100000 UNIT/ML suspension Commonly known as:  MYCOSTATIN Use as directed 5 mLs (500,000 Units total) 4 (four) times daily in the mouth or throat. Swish in your mouth 4 times per day and spit the medicine out.   prenatal multivitamin Tabs tablet Take 1 tablet by mouth daily at 12 noon.   progesterone 200 MG capsule Commonly known as:  PROMETRIUM Place 1 capsule (200 mg total) vaginally at bedtime.       Sharen Counter Certified Nurse-Midwife 09/21/2017 8:16 PM

## 2017-09-21 NOTE — MAU Note (Signed)
Pt sent up from clinic, pt states baby is very low in pelvis, also C/O clear/white liquid discharge since Saturday, also uc's since that time.  Contractions are usually with walking or standing too long.  Denies bleeding.

## 2017-09-22 ENCOUNTER — Telehealth: Payer: Self-pay | Admitting: *Deleted

## 2017-09-22 LAB — HEPATITIS B SURFACE ANTIGEN: Hepatitis B Surface Ag: NEGATIVE

## 2017-09-22 NOTE — Telephone Encounter (Signed)
Received a call from Marlin CanaryIda Shaw, Nutritionist at [facility] stating she received a faxed letter from Luna KitchensKathryn Kooistra, CNM for Ensure but states per [facility] protocol must have specific diagnosis; request a call.   I called Malachi BondsIda and she reports in order to get rx for Ensure must have diagnosis for an eating disorder like hyperemesis which patient does not have. I also reviewed chart and no eating disorder dx.  [facility] encouraged her to drink 2%milk.  I informed Malachi Bondsda I will send message to notify Samara DeistKathryn of this information.

## 2017-09-23 LAB — CULTURE, OB URINE

## 2017-09-23 LAB — URINE CULTURE, OB REFLEX

## 2017-09-26 ENCOUNTER — Inpatient Hospital Stay (HOSPITAL_COMMUNITY): Payer: Medicaid Other | Admitting: Anesthesiology

## 2017-09-26 ENCOUNTER — Encounter (HOSPITAL_COMMUNITY): Payer: Self-pay | Admitting: Anesthesiology

## 2017-09-26 ENCOUNTER — Inpatient Hospital Stay (HOSPITAL_COMMUNITY)
Admission: AD | Admit: 2017-09-26 | Discharge: 2017-09-29 | DRG: 768 | Disposition: A | Discharge status: Home / Self Care | Payer: Medicaid Other | Source: Ambulatory Visit | Attending: Obstetrics and Gynecology | Admitting: Obstetrics and Gynecology

## 2017-09-26 ENCOUNTER — Encounter (HOSPITAL_COMMUNITY): Payer: Self-pay | Admitting: Obstetrics and Gynecology

## 2017-09-26 DIAGNOSIS — Z3A33 33 weeks gestation of pregnancy: Secondary | ICD-10-CM | POA: Diagnosis not present

## 2017-09-26 DIAGNOSIS — O09899 Supervision of other high risk pregnancies, unspecified trimester: Secondary | ICD-10-CM

## 2017-09-26 DIAGNOSIS — O3430 Maternal care for cervical incompetence, unspecified trimester: Secondary | ICD-10-CM

## 2017-09-26 DIAGNOSIS — O42019 Preterm premature rupture of membranes, onset of labor within 24 hours of rupture, unspecified trimester: Secondary | ICD-10-CM

## 2017-09-26 DIAGNOSIS — O42919 Preterm premature rupture of membranes, unspecified as to length of time between rupture and onset of labor, unspecified trimester: Secondary | ICD-10-CM | POA: Diagnosis present

## 2017-09-26 DIAGNOSIS — O9952 Diseases of the respiratory system complicating childbirth: Secondary | ICD-10-CM | POA: Diagnosis present

## 2017-09-26 DIAGNOSIS — O42913 Preterm premature rupture of membranes, unspecified as to length of time between rupture and onset of labor, third trimester: Secondary | ICD-10-CM | POA: Diagnosis present

## 2017-09-26 DIAGNOSIS — O26879 Cervical shortening, unspecified trimester: Secondary | ICD-10-CM

## 2017-09-26 DIAGNOSIS — O411230 Chorioamnionitis, third trimester, not applicable or unspecified: Secondary | ICD-10-CM | POA: Diagnosis present

## 2017-09-26 DIAGNOSIS — O09219 Supervision of pregnancy with history of pre-term labor, unspecified trimester: Secondary | ICD-10-CM

## 2017-09-26 DIAGNOSIS — O3433 Maternal care for cervical incompetence, third trimester: Secondary | ICD-10-CM

## 2017-09-26 DIAGNOSIS — Z87891 Personal history of nicotine dependence: Secondary | ICD-10-CM

## 2017-09-26 DIAGNOSIS — O99824 Streptococcus B carrier state complicating childbirth: Secondary | ICD-10-CM | POA: Diagnosis not present

## 2017-09-26 DIAGNOSIS — O0990 Supervision of high risk pregnancy, unspecified, unspecified trimester: Secondary | ICD-10-CM

## 2017-09-26 LAB — CBC
HEMATOCRIT: 30.7 % — AB (ref 36.0–46.0)
HEMOGLOBIN: 10.6 g/dL — AB (ref 12.0–15.0)
MCH: 31.8 pg (ref 26.0–34.0)
MCHC: 34.5 g/dL (ref 30.0–36.0)
MCV: 92.2 fL (ref 78.0–100.0)
Platelets: 175 10*3/uL (ref 150–400)
RBC: 3.33 MIL/uL — ABNORMAL LOW (ref 3.87–5.11)
RDW: 12.5 % (ref 11.5–15.5)
WBC: 10.1 10*3/uL (ref 4.0–10.5)

## 2017-09-26 LAB — TYPE AND SCREEN
ABO/RH(D): B POS
ANTIBODY SCREEN: NEGATIVE

## 2017-09-26 LAB — WET PREP, GENITAL
CLUE CELLS WET PREP: NONE SEEN
Sperm: NONE SEEN
Trich, Wet Prep: NONE SEEN
Yeast Wet Prep HPF POC: NONE SEEN

## 2017-09-26 LAB — GROUP B STREP BY PCR: Group B strep by PCR: NEGATIVE

## 2017-09-26 MED ORDER — OXYTOCIN 40 UNITS IN LACTATED RINGERS INFUSION - SIMPLE MED
2.5000 [IU]/h | INTRAVENOUS | Status: DC
  Administered 2017-09-27: 2.5 [IU]/h via INTRAVENOUS
  Filled 2017-09-26: qty 1000

## 2017-09-26 MED ORDER — DEXTROSE 5 % IV SOLN
500.0000 mg | INTRAVENOUS | Status: DC
  Administered 2017-09-26: 500 mg via INTRAVENOUS
  Filled 2017-09-26 (×2): qty 500

## 2017-09-26 MED ORDER — FENTANYL 2.5 MCG/ML BUPIVACAINE 1/10 % EPIDURAL INFUSION (WH - ANES)
14.0000 mL/h | INTRAMUSCULAR | Status: DC | PRN
  Administered 2017-09-26 – 2017-09-27 (×2): 14 mL/h via EPIDURAL
  Filled 2017-09-26: qty 100

## 2017-09-26 MED ORDER — MAGNESIUM SULFATE BOLUS VIA INFUSION
6.0000 g | Freq: Once | INTRAVENOUS | Status: AC
  Administered 2017-09-26: 6 g via INTRAVENOUS
  Filled 2017-09-26: qty 500

## 2017-09-26 MED ORDER — AZITHROMYCIN 250 MG PO TABS: 500.0000 mg | ORAL_TABLET | Freq: Every day | ORAL | Status: DC

## 2017-09-26 MED ORDER — MAGNESIUM SULFATE 40 G IN LACTATED RINGERS - SIMPLE
2.0000 g/h | INTRAVENOUS | Status: DC
  Filled 2017-09-26: qty 500

## 2017-09-26 MED ORDER — FENTANYL 2.5 MCG/ML BUPIVACAINE 1/10 % EPIDURAL INFUSION (WH - ANES)
INTRAMUSCULAR | Status: AC
  Filled 2017-09-26: qty 100

## 2017-09-26 MED ORDER — ACETAMINOPHEN 325 MG PO TABS: 650.0000 mg | ORAL_TABLET | ORAL | Status: DC | PRN

## 2017-09-26 MED ORDER — LIDOCAINE HCL (PF) 1 % IJ SOLN
30.0000 mL | INTRAMUSCULAR | Status: DC | PRN
  Filled 2017-09-26 (×2): qty 30

## 2017-09-26 MED ORDER — PHENYLEPHRINE 40 MCG/ML (10ML) SYRINGE FOR IV PUSH (FOR BLOOD PRESSURE SUPPORT)
80.0000 ug | PREFILLED_SYRINGE | INTRAVENOUS | Status: DC | PRN
  Filled 2017-09-26: qty 5

## 2017-09-26 MED ORDER — AMOXICILLIN 500 MG PO CAPS
500.0000 mg | ORAL_CAPSULE | Freq: Three times a day (TID) | ORAL | Status: DC
Start: 2017-09-28 — End: 2017-09-27

## 2017-09-26 MED ORDER — SOD CITRATE-CITRIC ACID 500-334 MG/5ML PO SOLN: 30.0000 mL | ORAL | Status: DC | PRN

## 2017-09-26 MED ORDER — LIDOCAINE HCL (PF) 1 % IJ SOLN
INTRAMUSCULAR | Status: DC | PRN
  Administered 2017-09-26: 4 mL via EPIDURAL

## 2017-09-26 MED ORDER — LACTATED RINGERS IV SOLN: 500.0000 mL | INTRAVENOUS | Status: DC | PRN

## 2017-09-26 MED ORDER — LACTATED RINGERS IV SOLN
INTRAVENOUS | Status: DC
  Administered 2017-09-26: 125 mL/h via INTRAVENOUS
  Administered 2017-09-26 – 2017-09-27 (×2): via INTRAVENOUS

## 2017-09-26 MED ORDER — BETAMETHASONE SOD PHOS & ACET 6 (3-3) MG/ML IJ SUSP: 12.0000 mg | INTRAMUSCULAR | Status: DC

## 2017-09-26 MED ORDER — LACTATED RINGERS IV SOLN: 500.0000 mL | Freq: Once | INTRAVENOUS | Status: DC

## 2017-09-26 MED ORDER — EPHEDRINE 5 MG/ML INJ
10.0000 mg | INTRAVENOUS | Status: DC | PRN
  Filled 2017-09-26: qty 2

## 2017-09-26 MED ORDER — ONDANSETRON HCL 4 MG/2ML IJ SOLN
4.0000 mg | Freq: Four times a day (QID) | INTRAMUSCULAR | Status: DC | PRN
  Administered 2017-09-26 – 2017-09-27 (×2): 4 mg via INTRAVENOUS
  Filled 2017-09-26 (×2): qty 2

## 2017-09-26 MED ORDER — ALBUTEROL SULFATE (2.5 MG/3ML) 0.083% IN NEBU: 2.5000 mg | INHALATION_SOLUTION | Freq: Four times a day (QID) | RESPIRATORY_TRACT | Status: DC | PRN

## 2017-09-26 MED ORDER — DIPHENHYDRAMINE HCL 50 MG/ML IJ SOLN: 12.5000 mg | INTRAMUSCULAR | Status: DC | PRN

## 2017-09-26 MED ORDER — OXYTOCIN BOLUS FROM INFUSION
500.0000 mL | Freq: Once | INTRAVENOUS | Status: AC
  Administered 2017-09-27: 500 mL via INTRAVENOUS

## 2017-09-26 MED ORDER — PHENYLEPHRINE 40 MCG/ML (10ML) SYRINGE FOR IV PUSH (FOR BLOOD PRESSURE SUPPORT)
PREFILLED_SYRINGE | INTRAVENOUS | Status: AC
  Filled 2017-09-26: qty 10

## 2017-09-26 MED ORDER — LACTATED RINGERS IV SOLN
500.0000 mL | Freq: Once | INTRAVENOUS | Status: AC
  Administered 2017-09-26: 500 mL via INTRAVENOUS

## 2017-09-26 MED ORDER — SODIUM CHLORIDE 0.9 % IV SOLN
2.0000 g | Freq: Four times a day (QID) | INTRAVENOUS | Status: DC
  Administered 2017-09-26 – 2017-09-27 (×3): 2 g via INTRAVENOUS
  Filled 2017-09-26 (×6): qty 2000

## 2017-09-26 MED ORDER — BETAMETHASONE SOD PHOS & ACET 6 (3-3) MG/ML IJ SUSP
12.0000 mg | INTRAMUSCULAR | Status: DC
  Administered 2017-09-26: 12 mg via INTRAMUSCULAR
  Filled 2017-09-26 (×2): qty 2

## 2017-09-26 NOTE — H&P (Signed)
[id number] @ 33.0 wks in with SROM @ 1700 fluid is clear and pt is pushing on admission to unit.pt also has cerclage in place.  CSN: [id number]  Arrival date & time 09/26/17  1718   First Provider Initiated Contact with Patient 09/26/17 1731      No chief complaint on file.   HPI      Past Medical History:  Diagnosis Date  . Asthma   . Preterm labor          Past Surgical History:  Procedure Laterality Date  . CERVICAL CERCLAGE  05/27/2017   therapeutic. mcdonald, mersilene tape, anterior knot  . CYST EXCISION    . CYST REMOVAL HAND           Family History  Problem Relation Age of Onset  . Stroke Father     Social History        Tobacco Use  . Smoking status: Former Smoker    Types: Cigarettes    Last attempt to quit: 03/10/2017    Years since quitting: 0.5  . Smokeless tobacco: Never Used  Substance Use Topics  . Alcohol use: Yes    Comment: occasionally before pregnancy  . Drug use: No            OB History    Gravida Para Term Preterm AB Living   3 1 0 1 1 1    SAB TAB Ectopic Multiple Live Births   1       1      Review of Systems  Constitutional: Negative.   Eyes: Negative.   Respiratory: Negative.   Cardiovascular: Negative.   Gastrointestinal: Positive for abdominal pain.  Endocrine: Negative.   Genitourinary: Negative.   Musculoskeletal: Negative.   Skin: Negative.   Allergic/Immunologic: Negative.   Neurological: Negative.   Hematological: Negative.   Psychiatric/Behavioral: Negative.     Allergies  Orange fruit [citrus]; Shellfish allergy; Sulfa antibiotics; Cefzil [cefprozil]; Latex; Reglan [metoclopramide]; and Trileptal [oxcarbazepine]  Home Medications    BP 107/72 (BP Location: Left Arm)   Pulse (!) 107   Temp 98.8 F (37.1 C) (Oral)   Resp 18   Ht (P) 5' (1.524 m)   Wt (P) 111 lb (50.3 kg)   LMP 02/07/2017   BMI (P) 21.68 kg/m   Physical Exam  Constitutional: She is  oriented to person, place, and time. She appears well-developed and well-nourished.  HENT:  Head: Normocephalic.  Eyes: Pupils are equal, round, and reactive to light.  Neck: Normal range of motion.  Cardiovascular: Normal rate, regular rhythm, normal heart sounds and intact distal pulses.  Pulmonary/Chest: Effort normal and breath sounds normal.  Abdominal: Soft. Bowel sounds are normal.  Genitourinary: Vagina normal and uterus normal.  Musculoskeletal: Normal range of motion.  Neurological: She is alert and oriented to person, place, and time. She has normal reflexes.  Skin: Skin is warm and dry.  Psychiatric: She has a normal mood and affect. Her behavior is normal. Judgment and thought content normal.    MAU Course  Procedures (including critical care time)  Labs Reviewed  GROUP B STREP BY PCR  WET PREP, GENITAL  GC/CHLAMYDIA PROBE AMP ([place]) NOT AT [facility]   ImagingResults(Last48hours)  No results found.     No diagnosis found.    MDM  FHR 150's no decels. GBS, GC and Chla, wet prep obtained. Pt very uncooperative and verbal. Dr.Pickens in sterile spec exan done.copious amt clear fluid. SVE 2-3/90/-1 per Dr. Vergie LivingPickens. Plan admit  Antibiotics tocolytics

## 2017-09-26 NOTE — Anesthesia Pain Management Evaluation Note (Signed)
  CRNA Pain Management Visit Note  Patient: Beverly Santana, 21 y.o., female  "Hello I am a member of the anesthesia team at [facility]. We have an anesthesia team available at all times to provide care throughout the hospital, including epidural management and anesthesia for C-section. I don't know your plan for the delivery whether it a natural birth, water birth, IV sedation, nitrous supplementation, doula or epidural, but we want to meet your pain goals."   1.Was your pain managed to your expectations on prior hospitalizations?   Yes   2.What is your expectation for pain management during this hospitalization?     Epidural  3.How can we help you reach that goal? epidural  Record the patient's initial score and the patient's pain goal.   Pain: 4  Pain Goal: 6 [facility] wants you to be able to say your pain was always managed very well.  Neenah Canter 09/26/2017

## 2017-09-26 NOTE — MAU Provider Note (Signed)
History   [id number] @ 33.0 wks in with SROM @ 1700 fluid is clear and pt is pushing on admission to unit.pt also has cerclage in place.  CSN: [id number]  Arrival date & time 09/26/17  1718   First Provider Initiated Contact with Patient 09/26/17 1731      No chief complaint on file.   HPI  Past Medical History:  Diagnosis Date  . Asthma   . Preterm labor     Past Surgical History:  Procedure Laterality Date  . CERVICAL CERCLAGE  05/27/2017   therapeutic. mcdonald, mersilene tape, anterior knot  . CYST EXCISION    . CYST REMOVAL HAND      Family History  Problem Relation Age of Onset  . Stroke Father     Social History   Tobacco Use  . Smoking status: Former Smoker    Types: Cigarettes    Last attempt to quit: 03/10/2017    Years since quitting: 0.5  . Smokeless tobacco: Never Used  Substance Use Topics  . Alcohol use: Yes    Comment: occasionally before pregnancy  . Drug use: No    OB History    Gravida Para Term Preterm AB Living   3 1 0 1 1 1    SAB TAB Ectopic Multiple Live Births   1       1      Review of Systems  Constitutional: Negative.   Eyes: Negative.   Respiratory: Negative.   Cardiovascular: Negative.   Gastrointestinal: Positive for abdominal pain.  Endocrine: Negative.   Genitourinary: Negative.   Musculoskeletal: Negative.   Skin: Negative.   Allergic/Immunologic: Negative.   Neurological: Negative.   Hematological: Negative.   Psychiatric/Behavioral: Negative.     Allergies  Orange fruit [citrus]; Shellfish allergy; Sulfa antibiotics; Cefzil [cefprozil]; Latex; Reglan [metoclopramide]; and Trileptal [oxcarbazepine]  Home Medications    BP 107/72 (BP Location: Left Arm)   Pulse (!) 107   Temp 98.8 F (37.1 C) (Oral)   Resp 18   Ht (P) 5' (1.524 m)   Wt (P) 111 lb (50.3 kg)   LMP 02/07/2017   BMI (P) 21.68 kg/m   Physical Exam  Constitutional: She is oriented to person, place, and time. She appears well-developed and  well-nourished.  HENT:  Head: Normocephalic.  Eyes: Pupils are equal, round, and reactive to light.  Neck: Normal range of motion.  Cardiovascular: Normal rate, regular rhythm, normal heart sounds and intact distal pulses.  Pulmonary/Chest: Effort normal and breath sounds normal.  Abdominal: Soft. Bowel sounds are normal.  Genitourinary: Vagina normal and uterus normal.  Musculoskeletal: Normal range of motion.  Neurological: She is alert and oriented to person, place, and time. She has normal reflexes.  Skin: Skin is warm and dry.  Psychiatric: She has a normal mood and affect. Her behavior is normal. Judgment and thought content normal.    MAU Course  Procedures (including critical care time)  Labs Reviewed  GROUP B STREP BY PCR  WET PREP, GENITAL  GC/CHLAMYDIA PROBE AMP ([place]) NOT AT [facility]   No results found.   No diagnosis found.    MDM  FHR 150's no decels. GBS, GC and Chla, wet prep obtained. Pt very uncooperative and verbal. Dr.Pickens in sterile spec exan done.copious amt clear fluid. SVE 2-3/90/-1 per Dr. Vergie LivingPickens. Plan admit Antibiotics tocolytics

## 2017-09-26 NOTE — MAU Note (Addendum)
Per patient, she has a history of asthma, last used inhaler on yesterday (11/17 - early this morning). Inspiratory wheezing noted, Dr. Vergie LivingPickens informed. Pt.'s mother at the bedside providing emotional support.

## 2017-09-26 NOTE — Progress Notes (Signed)
On spec exam, patient very intolerant of exam. +clear LOF in the vaginal vault. Cervix visually 4cm with BOW and hair visible but unable to see cerclage stitch but I do see a hole on the anterior cervix (not bleeding with clear LOF streaming through). SVE done and 4cm/80/-1 and unable to palpate cerclage  Pt now amenable to epidural will place epidural and continue Mg for now and repeat exam to see if can get better visualization once pt is more comfortable  Beverly Santana, Jr MD Attending Center for [facility] (Faculty Practice) 09/26/2017 Time: 2150

## 2017-09-26 NOTE — Procedures (Signed)
Cerclage removal procedure note  Epidural placed by Anesthesia and working well  Sterile speculum inserted and cervix visualized. With much difficulty the anterior part of the cervix was brought into view and the cerclage tail and knot were seen coming from the inside part of the cervix (likely inverted with dilation and effacement) and fetal head and bow with clear fluid leaking out seen. Cervix visually 5cm. The tail was grasped with ringed forceps and the underside of the knot cut with long mayo scissors. The cerclage was then pulled through and inspected and noted to be approx 6cm and intact and was mersilene tape. EBL minimal. SVE at end of procedure 6-7/80/-1. Fetus category I  Beverly Santana, Jr MD Attending Center for [facility] (Faculty Practice) 09/26/2017 Time: [phone]

## 2017-09-26 NOTE — MAU Note (Signed)
Pt. Transferred to L&D for delivery.  FHR 130s-140s, Cat. 1 fetal tracing.  Contractions every 3-5 minutes each contraction lasting 60-110 sec.  Pt. Feeling contractions.  Magnesium sulfate in place, 6 gram bolus given earlier (see MAR), currently on 2 g maintenance dose.  Antibiotics given, see MAR, 1st dose of BMZ given, see MAR. Stable patient.  Mother of patient remains at the bedside, stepped out to get dinner, will be back.  IV site remains without s/s of infiltrate, infusing without complications.

## 2017-09-26 NOTE — Progress Notes (Addendum)
L&D Note  09/26/2017 - 8:44 PM  20 y.o. [id number] 3641w0d. Pregnancy complicated by PPROM today at 1700, urgent cerclage in place, preterm labor, h/o DV  Beverly Santana is admitted for PPROM, PTL   Subjective:  Stable UCs, pt comfortable  Objective:    Current Vital Signs 24h Vital Sign Ranges  T 98.8 F (37.1 C) Temp  Avg: 98.8 F (37.1 C)  Min: 98.8 F (37.1 C)  Max: 98.8 F (37.1 C)  BP 98/67 BP  Min: 92/60  Max: 107/72  HR 100 Pulse  Avg: 95.7  Min: 85  Max: 107  RR 17 Resp  Avg: 17.4  Min: 17  Max: 18  SaO2 100 %   SpO2  Avg: 100 %  Min: 100 %  Max: 100 %       24 Hour I/O Current Shift I/O  Time Ins Outs No intake/output data recorded. 11/17 1901 - 11/18 0700 In: -  Out: 600 [Urine:600]   FHR: 135 baseline, +accels, rare slight variables, mod variability Toco: q5-53m Gen: NAD, mild discomfort with UCs SVE: 5/80/-1, stitch in place ceph. Feels more dilated  Labs:  Recent Labs  Lab 09/26/17 1840  WBC 10.1  HGB 10.6*  HCT 30.7*  PLT 175   B POS  Medications Current Facility-Administered Medications  Medication Dose Route Frequency Provider Last Rate Last Dose  . acetaminophen (TYLENOL) tablet 650 mg  650 mg Oral Q4H PRN [place] BingPickens, Decarla Siemen, MD      . albuterol (PROVENTIL) (2.5 MG/3ML) 0.083% nebulizer solution 2.5 mg  2.5 mg Inhalation Q6H PRN [place] BingPickens, Tyshea Imel, MD      . ampicillin (OMNIPEN) 2 g in sodium chloride 0.9 % 50 mL IVPB  2 g Intravenous Q6H [place] BingPickens, Amarise Lillo, MD 150 mL/hr at 09/26/17 1918 2 g at 09/26/17 1918   Followed by  . [START ON 09/28/2017] amoxicillin (AMOXIL) capsule 500 mg  500 mg Oral Q8H Kalep Full, Billey Goslingharlie, MD      . azithromycin (ZITHROMAX) 500 mg in dextrose 5 % 250 mL IVPB  500 mg Intravenous Q24H [place] BingPickens, Kresha Abelson, MD 250 mL/hr at 09/26/17 1950 500 mg at 09/26/17 1950   Followed by  . [START ON 09/28/2017] azithromycin (ZITHROMAX) tablet 500 mg  500 mg Oral Daily [place] BingPickens, Hannan Hutmacher, MD      . betamethasone acetate-betamethasone  sodium phosphate (CELESTONE) injection 12 mg  12 mg Intramuscular Q24 Hr x 2 [place] MoritaLawson, Marie D, CNM   12 mg at 09/26/17 1825  . lactated ringers infusion 500-1,000 mL  500-1,000 mL Intravenous PRN [place] BingPickens, Geron Mulford, MD      . lactated ringers infusion   Intravenous Continuous [place] BingPickens, Kabria Hetzer, MD 125 mL/hr at 09/26/17 1830 125 mL/hr at 09/26/17 1830  . lidocaine (PF) (XYLOCAINE) 1 % injection 30 mL  30 mL Subcutaneous PRN [place] BingPickens, Shaden Lacher, MD      . magnesium sulfate 40 grams in LR 500 mL OB infusion  2 g/hr Intravenous Titrated [place] BingPickens, Hasset Chaviano, MD 25 mL/hr at 09/26/17 1915 2 g/hr at 09/26/17 1915  . ondansetron (ZOFRAN) injection 4 mg  4 mg Intravenous Q6H PRN [place] BingPickens, Kandice Schmelter, MD      . oxytocin (PITOCIN) IV BOLUS FROM BAG  500 mL Intravenous Once  BingPickens, Vinessa Macconnell, MD      . oxytocin (PITOCIN) IV infusion 40 units in LR 1000 mL - Premix  2.5 Units/hr Intravenous Continuous  BingPickens, Nikcole Eischeid, MD      . sodium citrate-citric acid (ORACIT) solution 30 mL  30 mL Oral Q2H PRN Hassani Sliney,  Billey Goslingharlie, MD        Assessment & Plan:  Progressing PTL *IUP: category I with accels *PPROM: d/w her that cx is changing on the Mg so recommend keeping, removing cerclage and allowing PTL to happen, but that newborn will most likely have to be transferred out due to NICU census; her and her mom are understanding. Will remove once she's over on L&D. NICU aware.  -d/c Mg once cerclage is out since she is progressing and she's already gotten a rescue dose of bmz -Rescue BMZ #1 given at 1830. First course was on 9/28 and 9/29 -continue latency abx *GBS: unknown. Tolerated the latency abx (amp and azithro well) *Analgesia: no current needs. Pt very intolerant of exams but doesn't want an epidural and wants to do VD w/o analgesia.   Cornelia Copaharlie Fantasha Daniele, Jr. MD Attending Center for [facility](Faculty Practice)

## 2017-09-26 NOTE — Anesthesia Procedure Notes (Signed)
Epidural Patient location during procedure: OB Start time: 09/26/2017 10:05 PM End time: 09/26/2017 10:11 PM  Staffing Anesthesiologist: Shelton SilvasHollis, Bert Ptacek D, MD Performed: anesthesiologist   Preanesthetic Checklist Completed: patient identified, site marked, surgical consent, pre-op evaluation, timeout performed, IV checked, risks and benefits discussed and monitors and equipment checked  Epidural Patient position: sitting Prep: ChloraPrep Patient monitoring: heart rate, continuous pulse ox and blood pressure Approach: midline Location: L3-L4 Injection technique: LOR saline  Needle:  Needle type: Tuohy  Needle gauge: 17 G Needle length: 9 cm Catheter type: closed end flexible Catheter size: 20 Guage Test dose: negative and 1.5% lidocaine  Assessment Events: blood not aspirated, injection not painful, no injection resistance and no paresthesia  Additional Notes LOR @ 4  Patient identified. Risks/Benefits/Options discussed with patient including but not limited to bleeding, infection, nerve damage, paralysis, failed block, incomplete pain control, headache, blood pressure changes, nausea, vomiting, reactions to medications, itching and postpartum back pain. Confirmed with bedside nurse the patient's most recent platelet count. Confirmed with patient that they are not currently taking any anticoagulation, have any bleeding history or any family history of bleeding disorders. Patient expressed understanding and wished to proceed. All questions were answered. Sterile technique was used throughout the entire procedure. Please see nursing notes for vital signs. Test dose was given through epidural catheter and negative prior to continuing to dose epidural or start infusion. Warning signs of high block given to the patient including shortness of breath, tingling/numbness in hands, complete motor block, or any concerning symptoms with instructions to call for help. Patient was given instructions  on fall risk and not to get out of bed. All questions and concerns addressed with instructions to call with any issues or inadequate analgesia.    Reason for block:procedure for pain

## 2017-09-26 NOTE — Anesthesia Preprocedure Evaluation (Signed)
Anesthesia Evaluation  Patient identified by MRN, date of birth, ID band  Reviewed: Allergy & Precautions, Patient's Chart, lab work & pertinent test results  Airway Mallampati: I       Dental  (+) Teeth Intact   Pulmonary asthma , former smoker,    breath sounds clear to auscultation       Cardiovascular negative cardio ROS   Rhythm:Regular Rate:Normal     Neuro/Psych negative neurological ROS     GI/Hepatic negative GI ROS, Neg liver ROS,   Endo/Other  negative endocrine ROS  Renal/GU negative Renal ROS     Musculoskeletal negative musculoskeletal ROS (+)   Abdominal   Peds  Hematology negative hematology ROS (+)   Anesthesia Other Findings Day of surgery medications reviewed with the patient.  Reproductive/Obstetrics (+) Pregnancy                             Lab Results  Component Value Date   WBC 10.1 09/26/2017   HGB 10.6 (L) 09/26/2017   HCT 30.7 (L) 09/26/2017   MCV 92.2 09/26/2017   PLT 175 09/26/2017     Anesthesia Physical Anesthesia Plan  ASA: II  Anesthesia Plan: Epidural   Post-op Pain Management:    Induction:   PONV Risk Score and Plan:   Airway Management Planned:   Additional Equipment:   Intra-op Plan:   Post-operative Plan:   Informed Consent: I have reviewed the patients History and Physical, chart, labs and discussed the procedure including the risks, benefits and alternatives for the proposed anesthesia with the patient or authorized representative who has indicated his/her understanding and acceptance.     Plan Discussed with:   Anesthesia Plan Comments:         Anesthesia Quick Evaluation

## 2017-09-26 NOTE — Progress Notes (Signed)
MAU OB Note  Category I with q5-5838m UCs. BMZ just given and Mg or abx hasn't been given yet.  Repeat SVE 4/80/-1/forebag felt and still with clear LOF. cx feels slightly more dilated. Cerclage palpated on cervix and doesn't feel under tension.  D/w her that will recheck SVE in 60-57m after Mg 6&2 goes in and if still changing, will have to keep, recommend cerclage removal and delivery here with infant unfortunately having to be transferred out due to full nicu census. If stable after Mg [facility].   Beverly Copaharlie Nickoles Santana, Jr MD Attending Center for [facility] 09/26/2017 Time: [id number]

## 2017-09-27 ENCOUNTER — Other Ambulatory Visit: Payer: Self-pay

## 2017-09-27 ENCOUNTER — Encounter (HOSPITAL_COMMUNITY): Payer: Self-pay | Admitting: General Practice

## 2017-09-27 DIAGNOSIS — O99824 Streptococcus B carrier state complicating childbirth: Secondary | ICD-10-CM

## 2017-09-27 DIAGNOSIS — Z3A33 33 weeks gestation of pregnancy: Secondary | ICD-10-CM

## 2017-09-27 LAB — RAPID URINE DRUG SCREEN, HOSP PERFORMED
Amphetamines: NOT DETECTED
BARBITURATES: NOT DETECTED
BENZODIAZEPINES: NOT DETECTED
COCAINE: NOT DETECTED
Opiates: NOT DETECTED
Tetrahydrocannabinol: NOT DETECTED

## 2017-09-27 LAB — RPR: RPR: NONREACTIVE

## 2017-09-27 LAB — HIV ANTIBODY (ROUTINE TESTING W REFLEX): HIV SCREEN 4TH GENERATION: NONREACTIVE

## 2017-09-27 MED ORDER — MEASLES, MUMPS & RUBELLA VAC ~~LOC~~ INJ
0.5000 mL | INJECTION | Freq: Once | SUBCUTANEOUS | Status: AC
  Administered 2017-09-29: 0.5 mL via SUBCUTANEOUS
  Filled 2017-09-27: qty 0.5

## 2017-09-27 MED ORDER — PRENATAL MULTIVITAMIN CH
1.0000 | ORAL_TABLET | Freq: Every day | ORAL | Status: DC
  Administered 2017-09-27 – 2017-09-29 (×3): 1 via ORAL
  Filled 2017-09-27 (×3): qty 1

## 2017-09-27 MED ORDER — SODIUM CHLORIDE 0.9 % IV SOLN: 250.0000 mL | INTRAVENOUS | Status: DC | PRN

## 2017-09-27 MED ORDER — SODIUM CHLORIDE 0.9% FLUSH: 3.0000 mL | INTRAVENOUS | Status: DC | PRN

## 2017-09-27 MED ORDER — DIBUCAINE 1 % RE OINT: 1.0000 "application " | TOPICAL_OINTMENT | RECTAL | Status: DC | PRN

## 2017-09-27 MED ORDER — OXYCODONE-ACETAMINOPHEN 5-325 MG PO TABS: 1.0000 | ORAL_TABLET | Freq: Four times a day (QID) | ORAL | Status: DC | PRN

## 2017-09-27 MED ORDER — OXYTOCIN 40 UNITS IN LACTATED RINGERS INFUSION - SIMPLE MED
1.0000 m[IU]/min | INTRAVENOUS | Status: DC
  Administered 2017-09-27: 2 m[IU]/min via INTRAVENOUS
  Administered 2017-09-27: 6 m[IU]/min via INTRAVENOUS

## 2017-09-27 MED ORDER — ZOLPIDEM TARTRATE 5 MG PO TABS: 5.0000 mg | ORAL_TABLET | Freq: Every evening | ORAL | Status: DC | PRN

## 2017-09-27 MED ORDER — IBUPROFEN 600 MG PO TABS
600.0000 mg | ORAL_TABLET | Freq: Four times a day (QID) | ORAL | Status: DC
  Administered 2017-09-27 – 2017-09-29 (×8): 600 mg via ORAL
  Filled 2017-09-27 (×8): qty 1

## 2017-09-27 MED ORDER — ONDANSETRON HCL 4 MG PO TABS: 4.0000 mg | ORAL_TABLET | ORAL | Status: DC | PRN

## 2017-09-27 MED ORDER — TETANUS-DIPHTH-ACELL PERTUSSIS 5-2.5-18.5 LF-MCG/0.5 IM SUSP: 0.5000 mL | Freq: Once | INTRAMUSCULAR | Status: DC

## 2017-09-27 MED ORDER — DIPHENHYDRAMINE HCL 25 MG PO CAPS: 25.0000 mg | ORAL_CAPSULE | Freq: Four times a day (QID) | ORAL | Status: DC | PRN

## 2017-09-27 MED ORDER — SODIUM CHLORIDE 0.9% FLUSH: 3.0000 mL | Freq: Two times a day (BID) | INTRAVENOUS | Status: DC

## 2017-09-27 MED ORDER — HYDROCODONE-ACETAMINOPHEN 5-325 MG PO TABS
1.0000 | ORAL_TABLET | ORAL | Status: DC | PRN
  Administered 2017-09-27 – 2017-09-28 (×3): 1 via ORAL
  Administered 2017-09-28 (×2): 2 via ORAL
  Filled 2017-09-27: qty 1
  Filled 2017-09-27: qty 2
  Filled 2017-09-27: qty 1
  Filled 2017-09-27: qty 2
  Filled 2017-09-27: qty 1

## 2017-09-27 MED ORDER — WITCH HAZEL-GLYCERIN EX PADS: 1.0000 "application " | MEDICATED_PAD | CUTANEOUS | Status: DC | PRN

## 2017-09-27 MED ORDER — TERBUTALINE SULFATE 1 MG/ML IJ SOLN: 0.2500 mg | Freq: Once | INTRAMUSCULAR | Status: DC | PRN

## 2017-09-27 MED ORDER — BENZOCAINE-MENTHOL 20-0.5 % EX AERO: 1.0000 "application " | INHALATION_SPRAY | CUTANEOUS | Status: DC | PRN

## 2017-09-27 MED ORDER — ACETAMINOPHEN 325 MG PO TABS: 650.0000 mg | ORAL_TABLET | ORAL | Status: DC | PRN

## 2017-09-27 MED ORDER — SIMETHICONE 80 MG PO CHEW: 80.0000 mg | CHEWABLE_TABLET | ORAL | Status: DC | PRN

## 2017-09-27 MED ORDER — SENNOSIDES-DOCUSATE SODIUM 8.6-50 MG PO TABS
2.0000 | ORAL_TABLET | ORAL | Status: DC
  Administered 2017-09-27 – 2017-09-28 (×2): 2 via ORAL
  Filled 2017-09-27 (×2): qty 2

## 2017-09-27 MED ORDER — COCONUT OIL OIL: 1.0000 "application " | TOPICAL_OIL | Status: DC | PRN

## 2017-09-27 MED ORDER — ONDANSETRON HCL 4 MG/2ML IJ SOLN: 4.0000 mg | INTRAMUSCULAR | Status: DC | PRN

## 2017-09-27 NOTE — Progress Notes (Signed)
Patient ID: Beverly Santana, female   DOB: 01-22-1996, 21 y.o.   MRN: [id number] Progress has been slow  Discussed with Dr Vergie LivingPickens  Vitals:   09/27/17 0201 09/27/17 0231 09/27/17 0301 09/27/17 0331  BP: (!) 99/58 100/60 (!) 100/58 99/61  Pulse: (!) 102 89 87 92  Resp:      Temp:      TempSrc:      SpO2:      Weight:      Height:       FHR 130s, reactive UCs irregular  Dilation: 7 Effacement (%): 80 Cervical Position: Middle Station: -1 Presentation: Vertex Exam by:: Marvell FullerPamela Rumley,RN  Will start Pitocin for augmentation of labor

## 2017-09-27 NOTE — Progress Notes (Signed)
Alger Memosndrea Collington Fleming is a 21 y.o. [id number] at 6027w1d by ultrasound admitted for rupture of membranes, Preterm labor  Subjective:   Objective: BP (!) 87/54   Pulse 89   Temp 97.6 F (36.4 C) (Oral)   Resp 16   Ht 5' (1.524 m)   Wt 111 lb (50.3 kg)   LMP 02/07/2017   SpO2 100%   BMI 21.68 kg/m  I/O last 3 completed shifts: In: -  Out: 600 [Urine:600] Total I/O In: -  Out: 400 [Urine:400]  FHT:  FHR: 116 bpm, variability: moderate,  accelerations:  Present,  decelerations:  Absent UC:   regular, every 2 minutes SVE:   Dilation: 6.5 Effacement (%): 90 Station: +1 Exam by:: Arne ClevelandJennifer Hazelwood, RN  Labs: Lab Results  Component Value Date   WBC 10.1 09/26/2017   HGB 10.6 (L) 09/26/2017   HCT 30.7 (L) 09/26/2017   MCV 92.2 09/26/2017   PLT 175 09/26/2017    Assessment / Plan: Spontaneous labor, progressing normally  Labor: Progressing normally Preeclampsia:  no signs or symptoms of toxicity Fetal Wellbeing:  Category I Pain Control:  Epidural I/D:  n/a Anticipated MOD:  NSVD  Wyvonnia DuskyMarie Margee Trentham 09/27/2017, 9:16 AM

## 2017-09-27 NOTE — Lactation Note (Signed)
This note was copied from a baby's chart. Lactation Consultation Note: Mother has a 33 week 1 day infant that is in the NICU. Mother reports that her first child was 28 weeks and was also in the NICU. Mother pumped and then was able to breastfeed  her infant for 8 months. Mother reports that she is active with WIC.   Assist mother with hand expression in a colostrum vial. Mother hand expressed 5 ml of colostrum. Encouraged to hand express before and after each pumping .  Mother has breastmilk labels and yellow colostrum dots.   Mother was sat up with a DEBP and instruct to pump every 2-3 hours for 15-20 mins.  Mother advised in collection, storage and cleaning pump parts.  Reviewed Breastfeeding you NICU baby. Mother was given Lactation brochure and advised in all services .Marland Kitchen. Mother to page when needed.  Mother reports that she had to use a nipple shield with first. Mother advised that when infant is ready to go to breast she can have LC paged for assistance. Encouraged frequent skin to skin when infant stable.   Patient Name: Beverly Santana Today's Date: 09/27/2017 Reason for consult: Initial assessment   Maternal Data Has patient been taught Hand Expression?: Yes Does the patient have breastfeeding experience prior to this delivery?: Yes  Feeding    LATCH Score                   Interventions Interventions: Hand express;Expressed milk;DEBP  Lactation Tools Discussed/Used WIC Program: Yes Pump Review: Setup, frequency, and cleaning;Milk Storage Initiated by:: Stevan BornSherry Byrl Latin RN,IBCLC Date initiated:: 09/27/17   Consult Status Consult Status: Follow-up Date: 09/28/17 Follow-up type: In-patient    Stevan BornKendrick, Ada [facility] 09/27/2017, 3:01 PM

## 2017-09-27 NOTE — Progress Notes (Addendum)
Patient ID: Beverly Santana, female   DOB: [birth year], 21 y.o.   MRN: [id number] Having cramping with breast feeding that pt finds painful, uncontrolled by ibuprofen. vicodin to be utilized prn tonight to further assess pain needs.

## 2017-09-28 ENCOUNTER — Other Ambulatory Visit: Payer: Self-pay

## 2017-09-28 LAB — GC/CHLAMYDIA PROBE AMP (~~LOC~~) NOT AT ARMC
CHLAMYDIA, DNA PROBE: NEGATIVE
Neisseria Gonorrhea: NEGATIVE

## 2017-09-28 NOTE — Telephone Encounter (Signed)
Letter give to patient on 09/17/17.

## 2017-09-28 NOTE — Anesthesia Postprocedure Evaluation (Signed)
Anesthesia Post Note  Patient: Alger Memosndrea Collington Fleming  Procedure(s) Performed: AN AD HOC LABOR EPIDURAL     Patient location during evaluation: [facility] Anesthesia Type: Epidural Level of consciousness: awake and alert and oriented Pain management: pain level controlled Vital Signs Assessment: post-procedure vital signs reviewed and stable Respiratory status: spontaneous breathing and nonlabored ventilation Cardiovascular status: stable Postop Assessment: no headache, patient able to bend at knees, no backache, no apparent nausea or vomiting, epidural receding and adequate PO intake Anesthetic complications: no    Last Vitals:  Vitals:   09/28/17 0150 09/28/17 0820  BP: (!) 99/53 92/64  Pulse: 82 74  Resp: 16 16  Temp: 36.9 C 37 C  SpO2: 100% 100%    Last Pain:  Vitals:   09/28/17 0820  TempSrc: Oral  PainSc:    Pain Goal: Patients Stated Pain Goal: 2 (09/28/17 0800)               Laban EmperorMalinova,Ily Denno Hristova

## 2017-09-28 NOTE — Lactation Note (Signed)
This note was copied from a baby's chart. Lactation Consultation Note  Patient Name: Beverly Santana ZOXWR'UToday's Date: 09/28/2017 Reason for consult: Follow-up assessment;NICU baby;Preterm <34wks;Infant < 6lbs   Follow up with mom of 25 hour old infant. Mom reports she pumped once yesterday and obtained 40 cc, she reports she has pumped once today and obtained 30 cc. Enc mom to pump every 2-3 hours with DEBP for 15 minutes. Mom reports she prefers to pump each breast separately and perform hands on pumping.   Mom reports she has been able to allow infant to nuzzle at the breast. She asked about getting a NS as she feels her nipple is too large. Asked mom to have infant's RN call when mom is at infant's bedside to [facility] for NS if needed. Mom voiced understanding.   Mom is going to NICU to visit infant. Enc mom to pump post visiting infant. Enc mom to follow pumping with hand expression, mom reports she knows how to hand express.   Mom asked for a cream for her nipples. Enc mom to use coconut oil to nipples before and after pumping. Mom reports she has coconut oil with her.   Mom reports she has no further questions/concerns at this time.    Maternal Data Formula Feeding for Exclusion: No Has patient been taught Hand Expression?: Yes Does the patient have breastfeeding experience prior to this delivery?: Yes  Feeding    LATCH Score                   Interventions    Lactation Tools Discussed/Used WIC Program: Yes Pump Review: Setup, frequency, and cleaning Initiated by:: Reviewed and encouraged every 2-3 hours   Consult Status Consult Status: Follow-up Date: 09/29/17 Follow-up type: In-patient    [name] 09/28/2017, 12:54 PM

## 2017-09-28 NOTE — Progress Notes (Addendum)
Daily Post Partum Note  09/28/2017 Beverly Santana is a 21 y.o. [id number] PPD#1 s/p  SVD/intact perineum  @ [redacted]w[redacted]d  Pregnancy c/b h/o cx insufficiency, PPROM, PTL, h/o DV 24hr/overnight events:  none  Subjective:  Meeting all pp goals  Objective:    Current Vital Signs 24h Vital Sign Ranges  T 98.6 F (37 C) Temp  Avg: 98.2 F (36.8 C)  Min: 97.7 F (36.5 C)  Max: 98.6 F (37 C)  BP 92/64 BP  Min: 92/64  Max: 124/85  HR 74 Pulse  Avg: 83.1  Min: 66  Max: 97  RR 16 Resp  Avg: 17  Min: 16  Max: 18  SaO2 100 % Not Delivered SpO2  Avg: 99.8 %  Min: 99 %  Max: 100 %       24 Hour I/O Current Shift I/O  Time Ins Outs 11/18 0701 - 11/19 0700 In: -  Out: 900 [Urine:650] No intake/output data recorded.    General: NAD Abdomen: nttp, nd. Firm fundus below the umbilicus Perineum: deferred Skin:  Warm and dry.  Cardiovascular: S1, S2 normal, no murmur, rub or gallop, regular rate and rhythm Respiratory:  Clear to auscultation bilateral. Normal respiratory effort Extremities: no c/c/e  Medications Current Facility-Administered Medications  Medication Dose Route Frequency Provider Last Rate Last Dose  . sodium chloride flush (NS) 0.9 % injection 3 mL  3 mL Intravenous Q12H LKeitha Butte CNM       And  . sodium chloride flush (NS) 0.9 % injection 3 mL  3 mL Intravenous PRN LKeitha Butte CNM       And  . 0.9 %  sodium chloride infusion  250 mL Intravenous PRN LKeitha Butte CNM      . acetaminophen (TYLENOL) tablet 650 mg  650 mg Oral Q4H PRN LKeitha Butte CNM      . benzocaine-Menthol (DERMOPLAST) 20-0.5 % topical spray 1 application  1 application Topical PRN LKeitha Butte CNM      . coconut oil  1 application Topical PRN LKeitha Butte CNM      . witch hazel-glycerin (TUCKS) pad 1 application  1 application Topical PRN LKeitha Butte CNM       And  . dibucaine (NUPERCAINAL) 1 % rectal ointment 1 application  1 application Rectal PRN LKeitha Butte  CNM      . diphenhydrAMINE (BENADRYL) capsule 25 mg  25 mg Oral Q6H PRN LKeitha Butte CNM      . HYDROcodone-acetaminophen (NORCO/VICODIN) 5-325 MG per tablet 1-2 tablet  1-2 tablet Oral Q4H PRN FJonnie Kind MD   1 tablet at 09/28/17 [id number] . ibuprofen (ADVIL,MOTRIN) tablet 600 mg  600 mg Oral Q6H LKeitha Butte CNM   600 mg at 09/28/17 0549  . measles, mumps and rubella vaccine (MMR) injection 0.5 mL  0.5 mL Subcutaneous Once LKeitha Butte CNM      . ondansetron (ZOFRAN) tablet 4 mg  4 mg Oral Q4H PRN LKeitha Butte CNM       Or  . ondansetron ([facility] injection 4 mg  4 mg Intravenous Q4H PRN LKeitha Butte CNM      . prenatal multivitamin tablet 1 tablet  1 tablet Oral Q1200 LKeitha Butte CNM   1 tablet at 09/27/17 1820  . senna-docusate (Senokot-S) tablet 2 tablet  2 tablet Oral Q24H LKeitha Butte CNM   2 tablet at 09/27/17 2349  . simethicone (  MYLICON) chewable tablet 80 mg  80 mg Oral PRN Keitha Butte, CNM      . Tdap (BOOSTRIX) injection 0.5 mL  0.5 mL Intramuscular Once Keitha Butte, CNM      . zolpidem (AMBIEN) tablet 5 mg  5 mg Oral QHS PRN Keitha Butte, CNM        Labs:  Recent Labs  Lab 09/26/17 1840  WBC 10.1  HGB 10.6*  HCT 30.7*  PLT 175   B POS   Assessment & Plan:  Pt doing well *Postpartum/postop: routine care. D/w pt re: [facility] tomorrow. Breast/pumping *h/o DV: SW consulted *Dispo: likely tomorrow.   Durene Romans MD Attending Center for [facility])

## 2017-09-28 NOTE — Progress Notes (Signed)
CSW acknowledges consult and completed clinical assessment.  Clinical documentation will follow.  There are no barriers to d/c.  Zurri Rudden Boyd-Gilyard, MSW, LCSW Clinical Social Work [phone]   

## 2017-09-29 MED ORDER — IBUPROFEN 600 MG PO TABS: 600.0000 mg | ORAL_TABLET | Freq: Four times a day (QID) | ORAL | 0 refills | Status: AC

## 2017-09-29 MED ORDER — HYDROCODONE-ACETAMINOPHEN 5-325 MG PO TABS: 1.0000 | ORAL_TABLET | Freq: Four times a day (QID) | ORAL | 0 refills | Status: AC | PRN

## 2017-09-29 NOTE — Discharge Instructions (Signed)
Vaginal Delivery, Care After Refer to this sheet in the next few weeks. These discharge instructions provide you with information on caring for yourself after delivery. Your caregiver may also give you specific instructions. Your treatment has been planned according to the most current medical practices available, but problems sometimes occur. Call your caregiver if you have any problems or questions after you go home. HOME CARE INSTRUCTIONS 1. Take over-the-counter or prescription medicines only as directed by your caregiver or pharmacist. 2. Do not drink alcohol, especially if you are breastfeeding or taking medicine to relieve pain. 3. Do not smoke tobacco. 4. Continue to use good perineal care. Good perineal care includes: 1. Wiping your perineum from back to front 2. Keeping your perineum clean. 3. You can do sitz baths twice a day, to help keep this area clean 5. Do not use tampons, douche or have sex until your caregiver says it is okay. 6. Shower only and avoid sitting in submerged water, aside from sitz baths 7. Wear a well-fitting bra that provides breast support. 8. Eat healthy foods. 9. Drink enough fluids to keep your urine clear or pale yellow. 10. Eat high-fiber foods such as whole grain cereals and breads, brown rice, beans, and fresh fruits and vegetables every day. These foods may help prevent or relieve constipation. 11. Avoid constipation with high fiber foods or medications, such as miralax or metamucil 12. Follow your caregiver's recommendations regarding resumption of activities such as climbing stairs, driving, lifting, exercising, or traveling. 13. Talk to your caregiver about resuming sexual activities. Resumption of sexual activities is dependent upon your risk of infection, your rate of healing, and your comfort and desire to resume sexual activity. 14. Try to have someone help you with your household activities and your newborn for at least a few days after you leave  the hospital. 15. Rest as much as possible. Try to rest or take a nap when your newborn is sleeping. 16. Increase your activities gradually. 17. Keep all of your scheduled postpartum appointments. It is very important to keep your scheduled follow-up appointments. At these appointments, your caregiver will be checking to make sure that you are healing physically and emotionally. SEEK MEDICAL CARE IF:   You are passing large clots from your vagina. Save any clots to show your caregiver.  You have a foul smelling discharge from your vagina.  You have trouble urinating.  You are urinating frequently.  You have pain when you urinate.  You have a change in your bowel movements.  You have increasing redness, pain, or swelling near your vaginal incision (episiotomy) or vaginal tear.  You have pus draining from your episiotomy or vaginal tear.  Your episiotomy or vaginal tear is separating.  You have painful, hard, or reddened breasts.  You have a severe headache.  You have blurred vision or see spots.  You feel sad or depressed.  You have thoughts of hurting yourself or your newborn.  You have questions about your care, the care of your newborn, or medicines.  You are dizzy or light-headed.  You have a rash.  You have nausea or vomiting.  You were breastfeeding and have not had a menstrual period within 12 weeks after you stopped breastfeeding.  You are not breastfeeding and have not had a menstrual period by the 12th week after delivery.  You have a fever. SEEK IMMEDIATE MEDICAL CARE IF:   You have persistent pain.  You have chest pain.  You have shortness of breath.    You faint.  You have leg pain.  You have stomach pain.  Your vaginal bleeding saturates two or more sanitary pads in 1 hour. MAKE SURE YOU:   Understand these instructions.  Will watch your condition.  Will get help right away if you are not doing well or get worse. Document Released:  10/24/2000 Document Revised: 03/13/2014 Document Reviewed: 06/23/2012 ExitCare Patient Information 2015 [employer]. This information is not intended to replace advice given to you by your health care provider. Make sure you discuss any questions you have with your health care provider.  Sitz Bath A sitz bath is a warm water bath taken in the sitting position. The water covers only the hips and butt (buttocks). We recommend using one that fits in the toilet, to help with ease of use and cleanliness. It may be used for either healing or cleaning purposes. Sitz baths are also used to relieve pain, itching, or muscle tightening (spasms). The water may contain medicine. Moist heat will help you heal and relax.  HOME CARE  Take 3 to 4 sitz baths a day. 18. Fill the bathtub half-full with warm water. 19. Sit in the water and open the drain a little. 20. Turn on the warm water to keep the tub half-full. Keep the water running constantly. 21. Soak in the water for 15 to 20 minutes. 22. After the sitz bath, pat the affected area dry. GET HELP RIGHT AWAY IF: You get worse instead of better. Stop the sitz baths if you get worse. MAKE SURE YOU:  Understand these instructions.  Will watch your condition.  Will get help right away if you are not doing well or get worse. Document Released: 12/04/2004 Document Revised: 07/21/2012 Document Reviewed: 02/24/2011 ExitCare Patient Information 2015 [employer]. This information is not intended to replace advice given to you by your health care provider. Make sure you discuss any questions you have with your health care provider.    
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
## 2017-09-29 NOTE — Progress Notes (Signed)
Discharge instructions and script given, questions answered, pt states understanding, signs and given copy 
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
## 2017-09-29 NOTE — Plan of Care (Signed)
Plan of care reviewed with patient. Expecting discharge tomorrow. Pt is ambulating off the unit to visit her infant in the NICU. She is tolerating activity and diet well. States she has not had a bowel movement in several days. Senna dose given and po fluids encouraged.

## 2017-09-30 NOTE — Progress Notes (Signed)
Pathology report reviewed;  Placenta shows acute Chorioamnionitis.

## 2017-10-05 ENCOUNTER — Encounter: Payer: Medicaid Other | Admitting: Advanced Practice Midwife

## 2017-10-05 ENCOUNTER — Ambulatory Visit: Payer: Self-pay

## 2017-10-05 NOTE — Lactation Note (Signed)
This note was copied from a baby's chart. Lactation Consultation Note  Patient Name: Boy Alger Memosndrea [place] Fleming ZOXWR'UToday's Date: 10/05/2017 Reason for consult: Follow-up assessment Mom has an abundant milk supply.  Pumping 17-18 bottles per day.  She would like to attempt to put Debby Budndre to breast.  He is showing good interest in feeding but unable to grasp nipple.  20 mm nipple shield applied and filled with milk.  He latched well and fed for 5 minutes before falling asleep.  Preterm feeding norms discussed with mom.  Encouraged to call for assist/concerns prn.  Maternal Data    Feeding Feeding Type: Breast Fed Length of feed: 5 min  LATCH Score Latch: Grasps breast easily, tongue down, lips flanged, rhythmical sucking.  Audible Swallowing: Spontaneous and intermittent  Type of Nipple: Flat  Comfort (Breast/Nipple): Soft / non-tender  Hold (Positioning): Assistance needed to correctly position infant at breast and maintain latch.  LATCH Score: 8  Interventions Interventions: Assisted with latch;Breast compression;Skin to skin;Adjust position;Breast massage;Support pillows;Hand express;Position options  Lactation Tools Discussed/Used Tools: Nipple Shields Nipple shield size: 20   Consult Status Consult Status: PRN    Huston FoleyMOULDEN, Raiquan Chandler S 10/05/2017, 5:06 PM

## 2017-11-04 ENCOUNTER — Ambulatory Visit: Payer: Medicaid Other

## 2018-02-06 ENCOUNTER — Encounter (HOSPITAL_COMMUNITY): Payer: Self-pay

## 2018-03-31 ENCOUNTER — Encounter (HOSPITAL_COMMUNITY): Payer: Self-pay | Admitting: Emergency Medicine

## 2018-03-31 ENCOUNTER — Emergency Department (HOSPITAL_COMMUNITY): Payer: Medicaid Other

## 2018-03-31 ENCOUNTER — Other Ambulatory Visit: Payer: Self-pay

## 2018-03-31 ENCOUNTER — Emergency Department (HOSPITAL_COMMUNITY)
Admission: EM | Admit: 2018-03-31 | Discharge: 2018-03-31 | Disposition: A | Discharge status: Home / Self Care | Payer: Medicaid Other | Attending: Emergency Medicine | Admitting: Emergency Medicine

## 2018-03-31 DIAGNOSIS — M6281 Muscle weakness (generalized): Secondary | ICD-10-CM | POA: Diagnosis not present

## 2018-03-31 DIAGNOSIS — M25511 Pain in right shoulder: Secondary | ICD-10-CM | POA: Insufficient documentation

## 2018-03-31 DIAGNOSIS — M79601 Pain in right arm: Secondary | ICD-10-CM | POA: Diagnosis not present

## 2018-03-31 DIAGNOSIS — F17200 Nicotine dependence, unspecified, uncomplicated: Secondary | ICD-10-CM | POA: Insufficient documentation

## 2018-03-31 DIAGNOSIS — R52 Pain, unspecified: Secondary | ICD-10-CM

## 2018-03-31 DIAGNOSIS — R51 Headache: Secondary | ICD-10-CM | POA: Insufficient documentation

## 2018-03-31 DIAGNOSIS — R0789 Other chest pain: Secondary | ICD-10-CM | POA: Diagnosis present

## 2018-03-31 LAB — COMPREHENSIVE METABOLIC PANEL WITH GFR
ALT: 9 U/L — ABNORMAL LOW (ref 14–54)
AST: 25 U/L (ref 15–41)
Albumin: 4 g/dL (ref 3.5–5.0)
Alkaline Phosphatase: 79 U/L (ref 38–126)
Anion gap: 7 (ref 5–15)
BUN: 16 mg/dL (ref 6–20)
CO2: 25 mmol/L (ref 22–32)
Calcium: 9.5 mg/dL (ref 8.9–10.3)
Chloride: 108 mmol/L (ref 101–111)
Creatinine, Ser: 0.79 mg/dL (ref 0.44–1.00)
GFR calc Af Amer: >60 mL/min
GFR calc non Af Amer: >60 mL/min
Glucose, Bld: 101 mg/dL — ABNORMAL HIGH (ref 65–99)
Potassium: 4.1 mmol/L (ref 3.5–5.1)
Sodium: 140 mmol/L (ref 135–145)
Total Bilirubin: 0.9 mg/dL (ref 0.3–1.2)
Total Protein: 7.3 g/dL (ref 6.5–8.1)

## 2018-03-31 LAB — CBC
HCT: 39.5 % (ref 36.0–46.0)
Hemoglobin: 13.1 g/dL (ref 12.0–15.0)
MCH: 30 pg (ref 26.0–34.0)
MCHC: 33.2 g/dL (ref 30.0–36.0)
MCV: 90.6 fL (ref 78.0–100.0)
Platelets: 260 10*3/uL (ref 150–400)
RBC: 4.36 MIL/uL (ref 3.87–5.11)
RDW: 12.2 % (ref 11.5–15.5)
WBC: 4.9 10*3/uL (ref 4.0–10.5)

## 2018-03-31 LAB — I-STAT CHEM 8, ED
BUN: 20 mg/dL (ref 6–20)
CREATININE: 0.7 mg/dL (ref 0.44–1.00)
Calcium, Ion: 1.16 mmol/L (ref 1.15–1.40)
Chloride: 104 mmol/L (ref 101–111)
Glucose, Bld: 101 mg/dL — ABNORMAL HIGH (ref 65–99)
HEMATOCRIT: 39 % (ref 36.0–46.0)
HEMOGLOBIN: 13.3 g/dL (ref 12.0–15.0)
POTASSIUM: 4 mmol/L (ref 3.5–5.1)
Sodium: 142 mmol/L (ref 135–145)
TCO2: 26 mmol/L (ref 22–32)

## 2018-03-31 LAB — URINALYSIS, ROUTINE W REFLEX MICROSCOPIC
Bacteria, UA: NONE SEEN
Bilirubin Urine: NEGATIVE
Glucose, UA: NEGATIVE mg/dL
Ketones, ur: NEGATIVE mg/dL
Leukocytes, UA: NEGATIVE
Nitrite: NEGATIVE
Protein, ur: NEGATIVE mg/dL
Specific Gravity, Urine: >1.046 — ABNORMAL HIGH (ref 1.005–1.030)
pH: 8 (ref 5.0–8.0)

## 2018-03-31 LAB — PROTIME-INR
INR: 0.99
Prothrombin Time: 13 s (ref 11.4–15.2)

## 2018-03-31 LAB — SAMPLE TO BLOOD BANK

## 2018-03-31 LAB — I-STAT CG4 LACTIC ACID, ED: Lactic Acid, Venous: 2.32 mmol/L (ref 0.5–1.9)

## 2018-03-31 LAB — HCG, QUANTITATIVE, PREGNANCY

## 2018-03-31 LAB — ETHANOL: Alcohol, Ethyl (B): <10 mg/dL (ref <10)

## 2018-03-31 MED ORDER — HYDROMORPHONE HCL 2 MG/ML IJ SOLN
0.5000 mg | Freq: Once | INTRAMUSCULAR | Status: AC
  Administered 2018-03-31: 0.5 mg via INTRAVENOUS
  Filled 2018-03-31: qty 1

## 2018-03-31 MED ORDER — PROCHLORPERAZINE EDISYLATE 10 MG/2ML IJ SOLN
10.0000 mg | Freq: Once | INTRAMUSCULAR | Status: AC
  Administered 2018-03-31: 10 mg via INTRAVENOUS
  Filled 2018-03-31: qty 2

## 2018-03-31 MED ORDER — FENTANYL CITRATE (PF) 100 MCG/2ML IJ SOLN
INTRAMUSCULAR | Status: AC | PRN
  Administered 2018-03-31: 50 ug via INTRAVENOUS

## 2018-03-31 MED ORDER — GADOBENATE DIMEGLUMINE 529 MG/ML IV SOLN
10.0000 mL | Freq: Once | INTRAVENOUS | Status: AC | PRN
  Administered 2018-03-31: 10 mL via INTRAVENOUS

## 2018-03-31 MED ORDER — LACTATED RINGERS IV BOLUS
1000.0000 mL | Freq: Once | INTRAVENOUS | Status: AC
  Administered 2018-03-31: 1000 mL via INTRAVENOUS

## 2018-03-31 MED ORDER — HYDROMORPHONE HCL 2 MG/ML IJ SOLN
INTRAMUSCULAR | Status: AC
  Filled 2018-03-31: qty 1

## 2018-03-31 MED ORDER — DIPHENHYDRAMINE HCL 25 MG PO CAPS
25.0000 mg | ORAL_CAPSULE | Freq: Once | ORAL | Status: AC
  Administered 2018-03-31: 25 mg via ORAL
  Filled 2018-03-31: qty 1

## 2018-03-31 MED ORDER — HYDROMORPHONE HCL 2 MG/ML IJ SOLN
0.5000 mg | Freq: Once | INTRAMUSCULAR | Status: AC
  Administered 2018-03-31: 0.5 mg via INTRAVENOUS

## 2018-03-31 MED ORDER — IOPAMIDOL (ISOVUE-370) INJECTION 76%
100.0000 mL | Freq: Once | INTRAVENOUS | Status: AC | PRN
  Administered 2018-03-31: 100 mL via INTRAVENOUS

## 2018-03-31 MED ORDER — KETOROLAC TROMETHAMINE 15 MG/ML IJ SOLN
15.0000 mg | Freq: Once | INTRAMUSCULAR | Status: AC
  Administered 2018-03-31: 15 mg via INTRAVENOUS
  Filled 2018-03-31: qty 1
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
## 2018-03-31 NOTE — ED Notes (Signed)
Pt to MRI with transport; piercing removed and in belongings at bedside

## 2018-03-31 NOTE — ED Notes (Signed)
Pt to CT 1 with RN on cardiac monitor
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
## 2018-03-31 NOTE — ED Notes (Signed)
Pt given sprite and crackers/peanut butter

## 2018-04-01 ENCOUNTER — Encounter (HOSPITAL_COMMUNITY): Payer: Self-pay

## 2018-04-01 LAB — CDS SEROLOGY
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
